# Patient Record
Sex: Female | Born: 1945 | Hispanic: No | Smoking: Never smoker
Health system: Southern US, Community
[De-identification: ages and names within clinical notes are randomized; demographics above are authoritative.]

## PROBLEM LIST (undated history)

## (undated) DIAGNOSIS — R519 Headache, unspecified: Secondary | ICD-10-CM

## (undated) DIAGNOSIS — G309 Alzheimer's disease, unspecified: Secondary | ICD-10-CM

## (undated) DIAGNOSIS — M797 Fibromyalgia: Secondary | ICD-10-CM

## (undated) DIAGNOSIS — I4891 Unspecified atrial fibrillation: Secondary | ICD-10-CM

## (undated) DIAGNOSIS — J302 Other seasonal allergic rhinitis: Secondary | ICD-10-CM

## (undated) DIAGNOSIS — F0280 Dementia in other diseases classified elsewhere without behavioral disturbance: Secondary | ICD-10-CM

## (undated) DIAGNOSIS — I10 Essential (primary) hypertension: Secondary | ICD-10-CM

## (undated) DIAGNOSIS — E7800 Pure hypercholesterolemia, unspecified: Secondary | ICD-10-CM

## (undated) DIAGNOSIS — R51 Headache: Secondary | ICD-10-CM

## (undated) DIAGNOSIS — G8929 Other chronic pain: Secondary | ICD-10-CM

## (undated) HISTORY — DX: Headache: R51

## (undated) HISTORY — DX: Other seasonal allergic rhinitis: J30.2

## (undated) HISTORY — DX: Pure hypercholesterolemia, unspecified: E78.00

## (undated) HISTORY — DX: Fibromyalgia: M79.7

## (undated) HISTORY — DX: Unspecified atrial fibrillation: I48.91

## (undated) HISTORY — DX: Alzheimer's disease, unspecified: G30.9

## (undated) HISTORY — PX: MEDIAL PARTIAL KNEE REPLACEMENT: SHX5965

## (undated) HISTORY — PX: OTHER SURGICAL HISTORY: SHX169

## (undated) HISTORY — DX: Dementia in other diseases classified elsewhere without behavioral disturbance: F02.80

## (undated) HISTORY — DX: Headache, unspecified: R51.9

## (undated) HISTORY — DX: Essential (primary) hypertension: I10

## (undated) HISTORY — PX: FOOT SURGERY: SHX648

## (undated) HISTORY — DX: Other chronic pain: G89.29

## (undated) HISTORY — PX: BACK SURGERY: SHX140

## (undated) HISTORY — PX: LUMBAR FUSION: SHX111

## (undated) HISTORY — PX: VESICOVAGINAL FISTULA CLOSURE W/ TAH: SUR271

---

## 1999-03-30 ENCOUNTER — Encounter: Payer: Self-pay | Admitting: Internal Medicine

## 1999-03-30 ENCOUNTER — Encounter: Admission: RE | Admit: 1999-03-30 | Discharge: 1999-03-30 | Payer: Self-pay | Admitting: Internal Medicine

## 1999-04-09 ENCOUNTER — Encounter: Admission: RE | Admit: 1999-04-09 | Discharge: 1999-04-09 | Payer: Self-pay | Admitting: Internal Medicine

## 1999-04-09 ENCOUNTER — Encounter: Payer: Self-pay | Admitting: Internal Medicine

## 1999-04-12 ENCOUNTER — Inpatient Hospital Stay (HOSPITAL_COMMUNITY): Admission: EM | Admit: 1999-04-12 | Discharge: 1999-04-13 | Payer: Self-pay | Admitting: Emergency Medicine

## 1999-04-12 ENCOUNTER — Encounter: Payer: Self-pay | Admitting: Emergency Medicine

## 1999-08-11 ENCOUNTER — Encounter: Payer: Self-pay | Admitting: *Deleted

## 1999-08-11 ENCOUNTER — Emergency Department (HOSPITAL_COMMUNITY): Admission: EM | Admit: 1999-08-11 | Discharge: 1999-08-11 | Payer: Self-pay | Admitting: *Deleted

## 2000-04-09 ENCOUNTER — Encounter: Payer: Self-pay | Admitting: Family Medicine

## 2000-04-09 ENCOUNTER — Encounter: Admission: RE | Admit: 2000-04-09 | Discharge: 2000-04-09 | Payer: Self-pay | Admitting: Family Medicine

## 2001-04-14 ENCOUNTER — Encounter: Admission: RE | Admit: 2001-04-14 | Discharge: 2001-04-14 | Payer: Self-pay | Admitting: Family Medicine

## 2001-04-14 ENCOUNTER — Encounter: Payer: Self-pay | Admitting: Family Medicine

## 2001-04-21 ENCOUNTER — Ambulatory Visit (HOSPITAL_COMMUNITY): Admission: RE | Admit: 2001-04-21 | Discharge: 2001-04-21 | Payer: Self-pay | Admitting: Cardiovascular Disease

## 2001-04-21 ENCOUNTER — Encounter: Payer: Self-pay | Admitting: Cardiovascular Disease

## 2002-04-16 ENCOUNTER — Encounter: Admission: RE | Admit: 2002-04-16 | Discharge: 2002-04-16 | Payer: Self-pay | Admitting: Family Medicine

## 2002-04-16 ENCOUNTER — Encounter: Payer: Self-pay | Admitting: Family Medicine

## 2003-02-14 ENCOUNTER — Encounter: Admission: RE | Admit: 2003-02-14 | Discharge: 2003-02-14 | Payer: Self-pay | Admitting: Neurosurgery

## 2003-03-07 ENCOUNTER — Encounter: Admission: RE | Admit: 2003-03-07 | Discharge: 2003-03-07 | Payer: Self-pay | Admitting: Neurosurgery

## 2003-03-21 ENCOUNTER — Encounter: Admission: RE | Admit: 2003-03-21 | Discharge: 2003-03-21 | Payer: Self-pay | Admitting: Neurosurgery

## 2003-03-26 DIAGNOSIS — F0280 Dementia in other diseases classified elsewhere without behavioral disturbance: Secondary | ICD-10-CM

## 2003-03-26 HISTORY — DX: Dementia in other diseases classified elsewhere, unspecified severity, without behavioral disturbance, psychotic disturbance, mood disturbance, and anxiety: F02.80

## 2003-04-05 ENCOUNTER — Encounter: Admission: RE | Admit: 2003-04-05 | Discharge: 2003-04-05 | Payer: Self-pay | Admitting: Neurosurgery

## 2003-04-22 ENCOUNTER — Encounter: Admission: RE | Admit: 2003-04-22 | Discharge: 2003-04-22 | Payer: Self-pay | Admitting: Family Medicine

## 2003-05-09 ENCOUNTER — Inpatient Hospital Stay (HOSPITAL_COMMUNITY): Admission: RE | Admit: 2003-05-09 | Discharge: 2003-05-17 | Payer: Self-pay | Admitting: Neurosurgery

## 2003-06-15 ENCOUNTER — Encounter: Admission: RE | Admit: 2003-06-15 | Discharge: 2003-06-15 | Payer: Self-pay | Admitting: *Deleted

## 2003-08-18 ENCOUNTER — Encounter: Admission: RE | Admit: 2003-08-18 | Discharge: 2003-08-18 | Payer: Self-pay | Admitting: Neurosurgery

## 2004-02-06 ENCOUNTER — Ambulatory Visit: Payer: Self-pay | Admitting: Family Medicine

## 2004-04-19 ENCOUNTER — Ambulatory Visit: Payer: Self-pay | Admitting: Family Medicine

## 2004-05-22 ENCOUNTER — Ambulatory Visit: Payer: Self-pay | Admitting: Family Medicine

## 2004-06-04 ENCOUNTER — Ambulatory Visit: Payer: Self-pay | Admitting: Family Medicine

## 2004-07-23 ENCOUNTER — Ambulatory Visit: Payer: Self-pay | Admitting: Family Medicine

## 2004-08-22 ENCOUNTER — Ambulatory Visit: Payer: Self-pay | Admitting: Family Medicine

## 2004-10-01 ENCOUNTER — Ambulatory Visit: Payer: Self-pay | Admitting: Family Medicine

## 2004-11-22 ENCOUNTER — Ambulatory Visit: Payer: Self-pay | Admitting: Family Medicine

## 2004-12-26 ENCOUNTER — Ambulatory Visit (HOSPITAL_COMMUNITY): Admission: RE | Admit: 2004-12-26 | Discharge: 2004-12-26 | Payer: Self-pay | Admitting: Specialist

## 2005-01-09 ENCOUNTER — Ambulatory Visit: Payer: Self-pay | Admitting: Family Medicine

## 2005-03-05 ENCOUNTER — Ambulatory Visit: Payer: Self-pay | Admitting: Family Medicine

## 2005-04-23 ENCOUNTER — Ambulatory Visit: Payer: Self-pay | Admitting: Family Medicine

## 2005-05-08 ENCOUNTER — Ambulatory Visit: Payer: Self-pay | Admitting: Family Medicine

## 2005-06-05 ENCOUNTER — Ambulatory Visit (HOSPITAL_BASED_OUTPATIENT_CLINIC_OR_DEPARTMENT_OTHER): Admission: RE | Admit: 2005-06-05 | Discharge: 2005-06-06 | Payer: Self-pay | Admitting: Orthopedic Surgery

## 2008-07-13 ENCOUNTER — Emergency Department (HOSPITAL_COMMUNITY): Admission: EM | Admit: 2008-07-13 | Discharge: 2008-07-13 | Payer: Self-pay | Admitting: Emergency Medicine

## 2009-07-25 ENCOUNTER — Encounter: Payer: Self-pay | Admitting: Pulmonary Disease

## 2009-08-16 ENCOUNTER — Ambulatory Visit: Payer: Self-pay | Admitting: Pulmonary Disease

## 2009-08-16 DIAGNOSIS — R0602 Shortness of breath: Secondary | ICD-10-CM | POA: Insufficient documentation

## 2009-08-16 DIAGNOSIS — Z9189 Other specified personal risk factors, not elsewhere classified: Secondary | ICD-10-CM | POA: Insufficient documentation

## 2009-08-16 DIAGNOSIS — J309 Allergic rhinitis, unspecified: Secondary | ICD-10-CM | POA: Insufficient documentation

## 2009-08-16 DIAGNOSIS — E785 Hyperlipidemia, unspecified: Secondary | ICD-10-CM

## 2009-08-16 DIAGNOSIS — I10 Essential (primary) hypertension: Secondary | ICD-10-CM | POA: Insufficient documentation

## 2009-08-16 LAB — CONVERTED CEMR LAB
BUN: 19 mg/dL (ref 6–23)
CO2: 30 meq/L (ref 19–32)
Calcium: 9.8 mg/dL (ref 8.4–10.5)
Chloride: 103 meq/L (ref 96–112)
Creatinine, Ser: 0.7 mg/dL (ref 0.4–1.2)
GFR calc non Af Amer: 86.42 mL/min (ref >60)
Glucose, Bld: 106 mg/dL — ABNORMAL HIGH (ref 70–99)
Potassium: 4.1 meq/L (ref 3.5–5.1)
Sodium: 142 meq/L (ref 135–145)

## 2009-08-17 ENCOUNTER — Ambulatory Visit: Payer: Self-pay | Admitting: Cardiology

## 2009-08-17 ENCOUNTER — Encounter: Payer: Self-pay | Admitting: Pulmonary Disease

## 2009-08-24 ENCOUNTER — Telehealth: Payer: Self-pay | Admitting: Pulmonary Disease

## 2010-01-25 ENCOUNTER — Inpatient Hospital Stay (HOSPITAL_COMMUNITY): Admission: RE | Admit: 2010-01-25 | Discharge: 2010-01-29 | Payer: Self-pay | Admitting: Specialist

## 2010-04-14 ENCOUNTER — Encounter: Payer: Self-pay | Admitting: Family Medicine

## 2010-04-15 ENCOUNTER — Encounter: Payer: Self-pay | Admitting: Pulmonary Disease

## 2010-04-24 NOTE — Progress Notes (Signed)
Summary: talk to nurse  Phone Note Call from Patient   Caller: Patient Call For: Bradford Cazier Summary of Call: pt call last week and have not received call back Initial call taken by: Rickard Patience,  August 24, 2009 9:57 AM  Follow-up for Phone Call        [email or web address]   Philipp Deputy [facility]  August 24, 2009 11:06 AM   pt wanted CT results. pt advised. Carron Curie CMA  August 25, 2009 3:48 PM

## 2010-04-24 NOTE — Letter (Signed)
Summary: [facility]  [facility]   Imported By: Sherian Rein 10/20/2009 13:24:19  _____________________________________________________________________  External Attachment:    Type:   Image     Comment:   External Document

## 2010-04-24 NOTE — Assessment & Plan Note (Signed)
Summary: SOB/ COUGH   Visit Type:  Initial Consult Copy to:  pcp Primary Provider/Referring Provider:  Dr. Orson Gear  CC:  Pt c/o non-productive  cough and increased SOB all the time worse with activity.  History of Present Illness: 65/F, never smoker for evaluation of dyspnea. She has fibromyalgia & chronic paina s a ersult. She has gained >50 lbs in the last 2-3 yrs. She reports dyspnea on exertion x 2 years. She reports an occasional dry cough but denies chest pain, orthopnea, palpitations or PND. She does not desire sleep testing since she will not be able to use a cpap - has seen her husband use this. Dr Allyson Sabal undertook cardiac testing . She desaturated to 90% with HR going up 25 points on walking around the office. Overnight oximetry did not show significant deaturation. CT chest with contrast 10/10 showed minimal scarring inthe right lung & left base. Ct angio was neg for PE.  Preventive Screening-Counseling & Management  Alcohol-Tobacco     Smoking Status: never  Current Medications (verified): 1)  Flonase 50 Mcg/act Susp (Fluticasone Propionate) .... As Needed 2)  Pravastatin Sodium 40 Mg Tabs (Pravastatin Sodium) .... Take 2 Tab By Mouth At Bedtime 3)  Cymbalta 30 Mg Cpep (Duloxetine Hcl) .... Take 2 Tablet By Mouth Once A Day 4)  Omeprazole 20 Mg Cpdr (Omeprazole) .... Take 2 Tablet By Mouth Once A Day 5)  Ibuprofen 400 Mg Tabs (Ibuprofen) .... Take 1 To 2 Tablet By Mouth Every 8 Hours As Needed 6)  Cyclobenzaprine Hcl 10 Mg Tabs (Cyclobenzaprine Hcl) .... Take 1 Tablet By Mouth Three Times A Day 7)  Alprazolam 0.5 Mg Tabs (Alprazolam) .... Take 1 Tab By Mouth At Bedtime 8)  Aricept 10 Mg Tabs (Donepezil Hcl) .... Take 1 Tablet By Mouth Once A Day 9)  Bisoprolol-Hydrochlorothiazide 10-6.25 Mg Tabs (Bisoprolol-Hydrochlorothiazide) .... Take 1 Tablet By Mouth Once A Day 10)  Ventolin Hfa 108 (90 Base) Mcg/act Aers (Albuterol Sulfate) .... As Needed  Allergies  (verified): 1)  ! Codeine  Past History:  Past Medical History: HEADACHE, CHRONIC, HX OF (ICD-V15.9) HYPERTENSION (ICD-401.9) HYPERLIPIDEMIA (ICD-272.4) ALLERGIC RHINITIS (ICD-477.9)    Past Surgical History: Back surgery-2005 Breast surgery-left  Hysterectomy-?1971  Family History: Family History Emphysema -father, sister Family History Asthma-sister Heart disease-sister  Social History: Marital Status: Married lives with Harland German Children: Yes Occupation: Retired Patient never smoked.  Smoking Status:  never  Review of Systems       The patient complains of shortness of breath with activity, shortness of breath at rest, non-productive cough, chest pain, weight change, difficulty swallowing, headaches, nasal congestion/difficulty breathing through nose, anxiety, depression, hand/feet swelling, and joint stiffness or pain.  The patient denies productive cough, coughing up blood, irregular heartbeats, acid heartburn, indigestion, loss of appetite, abdominal pain, sore throat, tooth/dental problems, sneezing, itching, ear ache, rash, change in color of mucus, and fever.    Vital Signs:  Patient profile:   65 year old female Height:      62 inches Weight:      232 pounds BMI:     42.59 O2 Sat:      94 % on Room air Temp:     97.7 degrees F oral Pulse rate:   65 / minute BP sitting:   122 / 80  (left arm) Cuff size:   large  Vitals Entered By: Zackery Barefoot CMA (Aug 16, 2009 11:18 AM)  O2 Flow:  Room air CC: Pt c/o non-productive  cough, increased SOB all the time worse with activity Comments Medications reviewed with patient Verified contact number and pharmacy with patient Zackery Barefoot CMA  Aug 16, 2009 11:19 AM  Ambulatory Pulse Oximetry  Resting; HR_87____    02 Sat__96%RA___  Lap1 (185 feet)   HR_94____   02 Sat_90%RA____ Lap2 (185 feet)   HR__89___   02 Sat_90%RA____    Lap3 (185 feet)   HR__86___   02 Sat_89%RA____  _X__Test Completed without  Difficulty ___Test Stopped due to: Although pt completed 3 laps I still checdked for recovery and her oxygen saturation approx 2 min after walk was 98%RA w/ a HR of 75 Denna Haggard, CMA  Aug 16, 2009 12:16 PM     Physical Exam  Additional Exam:  Gen. Pleasant, obese, in no distress, normal affect ENT - no lesions, no post nasal drip Neck: No JVD, no thyromegaly, no carotid bruits Lungs: no use of accessory muscles, no dullness to percussion, clear without rales or rhonchi  Cardiovascular: Rhythm regular, heart sounds  normal, no murmurs or gallops, no peripheral edema Abdomen: soft and non-tender, no hepatosplenomegaly, BS normal. Musculoskeletal: No deformities, no cyanosis or clubbing Neuro:  alert, non focal     CT of Chest  Procedure date:  08/17/2009  Findings:       IMPRESSION: No evidence of pulmonary embolus.   Scattered borderline sized mediastinal lymph nodes, presumably reactive.   Dependent atelectasis.  Pulmonary Function Test Date: 08/16/2009 11:58 AM Gender: Female  Pre-Spirometry FVC    Value: 2.40 L/min   % Pred: 85.90 % FEV1    Value: 1.78 L     Pred: 2.13 L     % Pred: 83.30 % FEV1/FVC  Value: 74.17 %     % Pred: 96.10 %  Impression & Recommendations:  Problem # 1:  DYSPNEA (ICD-786.05) Spirometry did not show airway obstruction & overnight oximetry appears nml Doubt pulmonary cause of dyspnea. If cardiac testing is also normal, then probable cause is obesity & deconditioning. Recommended rehab program but she was not enthusiastic about this. Orders: DME Referral (DME) Radiology Referral (Radiology) Consultation Level IV (16109) Spirometry w/Graph (94010)  Medications Added to Medication List This Visit: 1)  Flonase 50 Mcg/act Susp (Fluticasone propionate) .... As needed 2)  Pravastatin Sodium 40 Mg Tabs (Pravastatin sodium) .... Take 2 tab by mouth at bedtime 3)  Cymbalta 30 Mg Cpep (Duloxetine hcl) .... Take 2 tablet by mouth once a  day 4)  Omeprazole 20 Mg Cpdr (Omeprazole) .... Take 2 tablet by mouth once a day 5)  Ibuprofen 400 Mg Tabs (Ibuprofen) .... Take 1 to 2 tablet by mouth every 8 hours as needed 6)  Cyclobenzaprine Hcl 10 Mg Tabs (Cyclobenzaprine hcl) .... Take 1 tablet by mouth three times a day 7)  Alprazolam 0.5 Mg Tabs (Alprazolam) .... Take 1 tab by mouth at bedtime 8)  Aricept 10 Mg Tabs (Donepezil hcl) .... Take 1 tablet by mouth once a day 9)  Bisoprolol-hydrochlorothiazide 10-6.25 Mg Tabs (Bisoprolol-hydrochlorothiazide) .... Take 1 tablet by mouth once a day 10)  Ventolin Hfa 108 (90 Base) Mcg/act Aers (Albuterol sulfate) .... As needed  Other Orders: TLB-BMP (Basic Metabolic Panel-BMET) (80048-METABOL)  Patient Instructions: 1)  Copy sent to:dr shevlin 2)  Please schedule a follow-up appointment in 1 month. 3)  A Spiral CT of the Chest has been recommended.  Your imaging study may require preauthorization.    Immunization History:  Influenza Immunization History:    Influenza:  historical (01/23/2009)  Pneumovax Immunization History:    Pneumovax:  historical (06/29/2007)    CardioPerfect Spirometry  ID: [id number] Patient: DEEPTI, GUNAWAN DOB: 1945/08/19 Age: 65 Years Old Sex: Female Race: White Physician: Khloee Garza Height: 62 Weight: 232 Status: Confirmed Recorded: 08/16/2009 11:58 AM  Parameter  Measured Predicted %Predicted FVC     2.40        2.79        85.90 FEV1     1.78        2.13        83.30 FEV1%   74.17        77.21        96.10 PEF    4.09        5.55        73.60   Interpretation: Within normal limits

## 2010-04-24 NOTE — Procedures (Signed)
Summary: [facility]   Imported By: Lester Shelby 08/30/2009 10:04:18  _____________________________________________________________________  External Attachment:    Type:   Image     Comment:   External Document

## 2010-06-05 LAB — CBC
HCT: 29.1 % — ABNORMAL LOW (ref 36.0–46.0)
HCT: 29.6 % — ABNORMAL LOW (ref 36.0–46.0)
HCT: 31.7 % — ABNORMAL LOW (ref 36.0–46.0)
Hemoglobin: 10.2 g/dL — ABNORMAL LOW (ref 12.0–15.0)
MCH: 31.8 pg (ref 26.0–34.0)
MCH: 32 pg (ref 26.0–34.0)
MCH: 32.1 pg (ref 26.0–34.0)
MCH: 32.4 pg (ref 26.0–34.0)
MCHC: 34.2 g/dL (ref 30.0–36.0)
MCHC: 34.3 g/dL (ref 30.0–36.0)
MCHC: 34.3 g/dL (ref 30.0–36.0)
MCHC: 34.9 g/dL (ref 30.0–36.0)
MCV: 92.9 fL (ref 78.0–100.0)
MCV: 92.9 fL (ref 78.0–100.0)
Platelets: 174 10*3/uL (ref 150–400)
Platelets: 240 10*3/uL (ref 150–400)
RBC: 3.15 MIL/uL — ABNORMAL LOW (ref 3.87–5.11)
RDW: 13.5 % (ref 11.5–15.5)
RDW: 13.7 % (ref 11.5–15.5)
RDW: 13.7 % (ref 11.5–15.5)
WBC: 9.6 10*3/uL (ref 4.0–10.5)

## 2010-06-05 LAB — BASIC METABOLIC PANEL
BUN: 10 mg/dL (ref 6–23)
BUN: 4 mg/dL — ABNORMAL LOW (ref 6–23)
BUN: 5 mg/dL — ABNORMAL LOW (ref 6–23)
BUN: 7 mg/dL (ref 6–23)
CO2: 26 mEq/L (ref 19–32)
CO2: 28 mEq/L (ref 19–32)
CO2: 28 mEq/L (ref 19–32)
CO2: 29 mEq/L (ref 19–32)
Calcium: 8.6 mg/dL (ref 8.4–10.5)
Calcium: 8.9 mg/dL (ref 8.4–10.5)
Calcium: 9 mg/dL (ref 8.4–10.5)
Chloride: 100 mEq/L (ref 96–112)
Chloride: 102 mEq/L (ref 96–112)
Creatinine, Ser: 0.6 mg/dL (ref 0.4–1.2)
Creatinine, Ser: 0.66 mg/dL (ref 0.4–1.2)
GFR calc non Af Amer: >60 mL/min (ref >60)
GFR calc non Af Amer: >60 mL/min (ref >60)
Glucose, Bld: 107 mg/dL — ABNORMAL HIGH (ref 70–99)
Glucose, Bld: 111 mg/dL — ABNORMAL HIGH (ref 70–99)
Glucose, Bld: 115 mg/dL — ABNORMAL HIGH (ref 70–99)
Glucose, Bld: 116 mg/dL — ABNORMAL HIGH (ref 70–99)
Potassium: 3.8 mEq/L (ref 3.5–5.1)
Potassium: 3.8 mEq/L (ref 3.5–5.1)
Sodium: 139 mEq/L (ref 135–145)

## 2010-06-05 LAB — PROTIME-INR
INR: 1.12 (ref 0.00–1.49)
INR: 1.67 — ABNORMAL HIGH (ref 0.00–1.49)
Prothrombin Time: 16.4 seconds — ABNORMAL HIGH (ref 11.6–15.2)
Prothrombin Time: 19.9 seconds — ABNORMAL HIGH (ref 11.6–15.2)

## 2010-06-05 LAB — TYPE AND SCREEN: Antibody Screen: NEGATIVE

## 2010-06-06 LAB — DIFFERENTIAL
Eosinophils Absolute: 0.3 10*3/uL (ref 0.0–0.7)
Lymphocytes Relative: 37 % (ref 12–46)
Lymphs Abs: 2.9 10*3/uL (ref 0.7–4.0)
Monocytes Relative: 9 % (ref 3–12)
Neutro Abs: 4 10*3/uL (ref 1.7–7.7)
Neutrophils Relative %: 50 % (ref 43–77)

## 2010-06-06 LAB — URINALYSIS, ROUTINE W REFLEX MICROSCOPIC
Hgb urine dipstick: NEGATIVE
Nitrite: NEGATIVE
Protein, ur: NEGATIVE mg/dL
Urobilinogen, UA: 0.2 mg/dL (ref 0.0–1.0)

## 2010-06-06 LAB — CBC
HCT: 38.5 % (ref 36.0–46.0)
Hemoglobin: 13.1 g/dL (ref 12.0–15.0)
MCH: 31.9 pg (ref 26.0–34.0)
MCHC: 33.9 g/dL (ref 30.0–36.0)
RDW: 13.9 % (ref 11.5–15.5)

## 2010-06-06 LAB — COMPREHENSIVE METABOLIC PANEL
CO2: 27 mEq/L (ref 19–32)
Calcium: 9.7 mg/dL (ref 8.4–10.5)
Creatinine, Ser: 0.69 mg/dL (ref 0.4–1.2)
GFR calc non Af Amer: >60 mL/min (ref >60)
Glucose, Bld: 88 mg/dL (ref 70–99)
Total Protein: 6.8 g/dL (ref 6.0–8.3)

## 2010-06-06 LAB — APTT: aPTT: 31 seconds (ref 24–37)

## 2010-06-06 LAB — SURGICAL PCR SCREEN: Staphylococcus aureus: NEGATIVE

## 2010-06-06 LAB — PROTIME-INR
INR: 1.01 (ref 0.00–1.49)
Prothrombin Time: 13.5 seconds (ref 11.6–15.2)

## 2010-08-10 NOTE — Op Note (Signed)
Christie Sanders, Christie Sanders                ACCOUNT NO.:  [id number]   MEDICAL RECORD NO.:  [id number]          PATIENT TYPE:  AMB   LOCATION:  DAY                          FACILITY:  [facility]   PHYSICIAN:  Jene Every, M.D.    DATE OF BIRTH:  [birth year]   DATE OF PROCEDURE:  12/26/2004  DATE OF DISCHARGE:                                 OPERATIVE REPORT   PREOPERATIVE DIAGNOSIS:  Medial meniscus tear, degenerative joint disease.  Subchondral defect, medial femoral condyle.   POSTOPERATIVE DIAGNOSIS:  Medial meniscus tear, degenerative joint disease.  Subchondral defect, medial femoral condyle.   PROCEDURE PERFORMED:  Right knee arthroscopy, posterior medial meniscectomy,  chondroplasty, medial femoral condyle and of the patella, abrasion  arthroplasty and microfracture technique of the medial femoral condyle.   ANESTHESIA:  General.   ASSISTANT:  Roma Schanz, P.A.   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  Medial joint symptoms.  Operative intervention was indicated for posterior medial meniscectomy,  debridement, and abrasion of the area of OCD and AVN of the medial femoral  condyle.  Risks and benefits, including bleeding, infection, no change in  symptoms, worsening symptoms, need for total knee arthroplasty in the  future.  It is felt to be a chance to the medial femoral condyle portion in  an effort to avoid a knee replacement.  This was discussed in detail with  the patient that this type of procedure may very well have not  revascularized the femoral condyle, and she may require a total knee  arthroplasty, even within six weeks of this procedure.  It is hoped that the  posterior medial meniscectomy and debridement would help at least a portion  of her symptoms.   PROCEDURE:  The medial parapatellar portal was fashioned with a #11 blade  after localization with an 18 gauge needle,  sparing the medial meniscus.  Inspection revealed some chondral flap tears of the medial femoral condyle,  complex tear of the posterior half of the medial meniscus.  A basket rongeur  was introduced and utilized to perform partial medial meniscectomy to a  stable base with further contour with the 4.2 coude shaver. Loose  cartilaginous debris was debrided from the medial femoral condyle from the  medial-most aspect of that.  An area of the grade 4 change in the area  corresponding to the MRI.  The area of AVN/coude shaver was utilized to  abrade this area.  I did take a small awl and just hand-pushed to some  punctate areas approximately 1 cm apart.  Thinned this lesion that measured  approximately 1 cm wide.  Reduced the pressure.  Good bleeding bone was  noted.  No fragmentation noted.  The edges were without chondral flap tears.  __________ in the medial femoral condyle were normal.  The femoral condyle  and tibial plateau and meniscus were stable to probe palpation over the  significant chondral lesion.  There were grade 3 changes of the patella.  This chondroplasty was performed here as well.  The  knee was copiously  lavaged.  Examined the gutters in all compartments.  No residual meniscus.  Again, good bleeding tissue was noted at the site of the AVN.  Next, all  instrumentation was removed.  Portals were closed with 4-0 nylon simple  sutures.  Marcaine 0.25% with epinephrine was infiltrated into the joint.  The wound  was dressed sterilely.  She was awakened without difficulty.  Transported to  the recovery room in satisfactory condition.  Patient tolerated the  procedure well with no complications.      Jene Every, M.D.  Electronically Signed     JB/MEDQ  D:  12/26/2004  T:  12/26/2004  Job:  [id number]

## 2010-08-10 NOTE — Op Note (Signed)
NAME:  Christie Sanders, Christie Sanders                          ACCOUNT NO.:  [id number]   MEDICAL RECORD NO.:  [id number]                   PATIENT TYPE:  INP   LOCATION:  2899                                 FACILITY:  [facility]   PHYSICIAN:  Kathaleen Maser. Pool, M.D.                 DATE OF BIRTH:  [birth year]   DATE OF PROCEDURE:  05/09/2003  DATE OF DISCHARGE:                                 OPERATIVE REPORT   PREOPERATIVE DIAGNOSES:  L2-3, L3-4, L4-5 and L5-S1 degenerative disk  disease with degenerative scoliosis and grade 2 L5-S1spondylolisthesis.   POSTOPERATIVE DIAGNOSES:  L2-3, L3-4, L4-5 and L5-S1 degenerative disk  disease with degenerative scoliosis and grade 2 L5-S1spondylolisthesis.   OPERATION PERFORMED:  L2-3, L3-4, L4-5 and L5-S1 decompressive laminectomies  with foraminotomy, Gill procedure.  L2-3, L3-4, L4-5 and L5-S1 posterior  lumbar interbody fusion utilizing tangent wedges and local autografting.  L2  through S1 posterolateral fusion utilizing segmental pedicle screw  instrumentation and local autografting.   SURGEON:  Kathaleen Maser. Pool, M.D.   ASSISTANT:  Tia Alert, MD   ANESTHESIA:  General endotracheal.   INDICATIONS FOR PROCEDURE:  The patient is a 65 year old female with a  history of chronic back and bilateral lower extremity symptoms failing all  conservative management.  Work-ups demonstrated evidence of severe  multilevel degenerative disk disease with degenerative scoliosis causing  stenosis and marked structural deformity.  At L5-S1 she also had evidence of  a grade 2 spondylolisthesis which appears to be degenerative in nature.  The  patient has been counseled as to her options.  She has decided to proceed  with multilevel decompression and fusion for hopeful improvement in her  symptoms.   DESCRIPTION OF PROCEDURE:  The patient was taken to the operating room and  placed on the table in the supine position.  After adequate level of  anesthesia was achieved, the  patient was positioned prone onto a Wilson  frame and appropriately padded.  The patient's lumbar region was prepped and  draped sterilely.  A 10 blade was used to make a linear skin incision  extending from L1 down to the midsacrum.  This was carried down sharply in  the midline.  A subperiosteal dissection was then performed exposing the  lamina and facet joints as well as the transverse processes of L2, L3, L4,  L5 and the sacrum and ala bilaterally.  Deep self-retaining retractor was  placed.  Intraoperative fluoroscopy was used, the levels were confirmed.  Decompressive laminectomy in the form of a Gill procedure was then performed  at L2, L3, L4 and L5.  The inferior facets of the aforementioned level were  all removed as were the complete lamina.  Superior facets of L3, 4, 5 and S1  were also resected.  All bone was cleaned and used in later autografting.  Epidural scarring and ligamentum flavum was elevated and resected in  piecemeal  fashion using Kerrison rongeurs.  The underlying thecal sac and  exiting L2, L3, L4, L5 and S1 nerve roots were identified.  Wide  foraminotomies were performed along the course of the exiting nerve roots.  Starting first at L2-3, the epidural venous plexus was coagulated and cut.  Thecal sac and nerve roots were mobilized and retracted toward the midline.  Disk space was isolated and incised with a 15 blade in rectangular fashion.  Wide disk space cleanout was then achieved using pituitary rongeurs and  upward angled pituitary rongeurs and Epstein curets.  The procedure was then  repeated on the contralateral side.  Disk space was then subsequently  distracted up to 10 mm, a 10 mm distractor was left in the patient's right  side.  The thecal sac and nerve root was protected on the left side.  The  disk space was then reamed with 10 mm tangent box cutter and then cut with  10 chisel.  Soft tissues were then removed from the interspace.  A 10 x 26  mm  tangent wedge was then impacted into place and recessed approximately 2  mm from the posterior cortical margin.  Distractor was removed from the  patient's right side disk space with further curettage.  Morcelized  allograft was packed in the interspace.  A second 10 x 26 mm tangent wedge  was then impacted on the patient's right side.  The procedure was then  repeated at the L3-4, L4-5 and L5-S1 levels, this time using 8 mm tangent  grafts bilaterally as well as local autografting as well.  There were no  complications with any of the levels.  Pedicles at L2, 3, 4, 5 and S1 were  identified utilizing surface landmarks and intraoperative fluoroscopy.  Superficial bone overlying the pedicles was removed using high speed drill.  Each pedicle was then probed using pedicle awl.  Each pedicle awl track was  then tapped with a 5.25 mm screw tap.  Each screw tap hole was probed and  found to be solid within bone.  6.75 x 40 mm spiral 90 D pedicle screws were  placed bilaterally at L2, 3, 4 and 5.  6.75 x 35 mm screws were placed  bilaterally at S1.  All 10 screws were found to be well positioned by both  direct visualization and intraoperative fluoroscopy.  The transverse  processes and lateral facet joint complexes were then decorticated using a  high speed drill.  Morcelized autograft was packed posterolaterally for  later fusion.  A short segment titanium rod was then contoured and placed in  the screw heads from L2 to S1 bilaterally.  Locking cap was then engaged  over the screw heads.  They were given a final tightening with the construct  under compression.  Transverse connectors were placed times two. Final  images revealed good position of bone graft and hardware at the proper  operative level with normal alignment of the spine.  Medium Hemovac drain  was left in the epidural space.  Gelfoam was placed topically for hemostasis which was found to be good.  The wound was then irrigated one  final time and  then closed in layers with Vicryl sutures.  Steri-Strips and sterile  dressing were applied.  There were no apparent complications.  The patient  tolerated the procedure well and returned to the recovery room  postoperatively.  Henry A. Pool, M.D.    HAP/MEDQ  D:  05/09/2003  T:  05/09/2003  Job:  [id number]

## 2010-08-10 NOTE — Op Note (Signed)
Christie Sanders, Christie Sanders                ACCOUNT NO.:  [id number]   MEDICAL RECORD NO.:  [id number]          PATIENT TYPE:  AMB   LOCATION:  DSC                          FACILITY:  [facility]   PHYSICIAN:  Leonides Grills, M.D.     DATE OF BIRTH:  1945-08-18   DATE OF PROCEDURE:  06/05/2005  DATE OF DISCHARGE:                                 OPERATIVE REPORT   PREOPERATIVE DIAGNOSIS:  1.  Right transmetatarsal joint hypermobility with arthritis.  2.  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  3.  Right tight gastroc.  4.  Right second metatarsalgia.  5.  Right second metatarsal fracture nonunion.  6.  Right complication of hardware, foot.  7.  Right second through fifth hammer toes.   POSTOPERATIVE DIAGNOSIS:  1.  Right transmetatarsal joint hypermobility with arthritis.  2.  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  3.  Right tight gastroc.  4.  Right second metatarsalgia.  5.  Right second metatarsal fracture nonunion.  6.  Right complication of hardware, foot.  7.  Right second through fifth hammer toes.   OPERATION:  1.  Right first transmetatarsal joint fusion with plantar flexion osteotomy.  2.  Right second and third transmetatarsal joint fusion.  3.  Right local bone graft.  4.  Stress x-rays, right foot.  5.  Right gastroc slide.  6.  Right second Weil metatarsal shortening osteotomy.  7.  Open reduction and internal fixation right second metatarsal fracture      nonunion.  8.  Right second through fifth toes metatarsal phalangeal joint dorsal      capsulotomies with collateral releases.  9.  Right second through fifth toes proximal phalanx head resections.  10. Right second through fifth toes extensor digitorum brevis to extensor      digitorum longus tendon transfers.  11. Right hardware removal, deep foot.   ANESTHESIA:  General.   SURGEON:  Leonides Grills, M.D.   ASSISTANT:  Lianne Cure, P.A.-C.   ESTIMATED BLOOD LOSS:  Minimal.   TOURNIQUET TIME:  2 hours.   COMPLICATIONS:  None.   DISPOSITION:  Stable to the PR.   INDICATIONS FOR PROCEDURE:  This is a 65 year old female who had previous  surgery and developed mid foot pain as well as forefoot pain with hammer toe  deformities and distalization of fat pad with tight gastroc.  She was  consented for the above procedure.  All risks which include infection,  neurovascular injury, malunion, nonunion, hardware rotation, hardware  failure, persistent pain, worsening pain, prolonged recovery, arthritis of  neighboring joints, stiffness, recurrence of deformity, were all explained,  questions were encouraged and answered.   OPERATION:  The patient was brought to the operating room and placed in  supine position.  After adequate general endotracheal anesthesia was  administered as well as Ancef 1 gram IV piggyback, a small bump was placed  under the right ipsilateral hip, all bony prominences were well padded.  The  right lower extremity was then prepped and draped in a sterile manner over a  proximally placed thigh tourniquet.  We  started the procedure with a  longitudinal incision over the medial aspect of the gastrocnemius muscle  tendinous junction.  Dissection was carried down through skin, hemostasis  was obtained.  The fascia was opened in line with the incision.  The  conjoined region was then developed between the gastroc and soleus.  The  soft tissue was then elevated off the posterior aspect of the gastrocnemius.  The sural nerve was identified and protected posteriorly.  The gastrocnemius  was then released with the curved Mayo scissors.  This had an outstanding  release of the tight gastroc.  The area was copiously irrigated with normal  saline.  The subcu was closed with 3-0 Vicryl and the skin was closed with 4-  0 Monocryl subcuticular stitch.  Steri-Strips were applied.   The limb was then gravity exsanguinated and the tourniquet was elevated to  290  mmHg.  A longitudinal incision over the previous incision was made  medial to the EHL tendon.  Dissection was carried down through the skin,  hemostasis was obtained.  The incision actually traversed the EHL.  The EHL  was protected in its sheath throughout the procedure.  We then identified  the first and second TMT joints.  We did not dissect out the nonunion site  for there was too much risk to the dorsalis pedes artery and we elected not  to do this.  We then proceeded to elevate over the area respectively.  We  then dissected distally down through the K-wires which appeared to be  prominent plantarly in the lateral view.  We then removed the K-wires that  were down to bone with needle nose pliers.  When both K-wires were intact,  no evidence of hardware failure.  We then made a notch approximately 2 cm  distal to the first TMT joint line.  We then entered the first TMT joint.  There was a tremendous amount of spur around this area.  This was then  removed with the rongeur and placed on the back table for local bone graft.  There was erosion dorsally and there was gross motion through this joint.  We then made an osteotomy where we resected a wedge based plantar off the  base of the first and medial cuneiform.  We then reduced the joint with a  two point reduction clamp and palpated the first metatarsal head plantarly  and it was in excellent position.  We also checked in the lateral view, as  well.  We then prepared either side of the joint surface with curved 1/4  inch osteotome, again, which was done prior to this as well as multiple 2 mm  drill holes on either side of the joint.  We then placed a two point  reduction clamp back and compressed the fusion site respectively.  Again,  this was rechecked and there was no change in the plantar flexion of the  first ray.  We then placed two 3.5 mm fully threaded cortical lag screws using 3.5 and 2.5 mm drill holes respectively with  excellent purchase and  compression across the fusion site.  The heads were counter sunk, as well.   Once this was completed, we then made a longitudinal incision over the  lateral aspect of the second and over the third tarsal metatarsal joints.  Dissection was carried down through the skin, hemostasis was obtained.  The  extensor tendons were protected and retracted out of harms way.  We then  entered the second and  third TMT joints.  These were both prepared and  cartilage was removed which was minimal in both joints.  Once the cartilage  was removed, multiple 2 mm drill holes were placed on either side of the  joint.  The nonunion site was prepared which had gross motion. There was  cartilaginous material in this area, as well.  Once this was completely  removed and was down to bone, multiple 2 mm drill holes were placed on  either side of the joint and this was compressed with a two point reduction  clamp.  We then applied a six hole quarter tubular plate dorsally.  We  placed two screws in a compression manner on either side of the nonunion  site initially and this compressed the nonunion site, very well.  This was  checked with a Glorious Peach.  We then placed a two point reduction clamp across the  second TMT joint and compressed this down, as well, and placed a screw from  the medial cuneiform into the base of the second metatarsal.  This had  excellent purchase and compression of this area.  We kept the compression in  this area and placed a screw on either side of the TMT joint in compression,  as well.  Again, this had outstanding maintenance of position and keeping  compression across the fusion site.  We then placed a 2.7 screw in the most  distal hole and the most proximal hole, as well, and we obtained x-rays.  Stress x-ray showed no gross motion and fixation of the proper position, as  well.  Throughout this procedure, local bone graft obtained from the drills  as well as spurs  were placed on the back table, as well.   We then placed a longitudinal incision on the dorsal aspect of the second  toe, dissection was carried down through the skin, hemostasis was obtained.  EDL and EDB tendons were identified and the EDL was tenotomized proximal  medial and the brevis distal lateral and retracted out of harms way for  later transfer.  A dorsal capsulotomy with collateral release was then  performed and the distal aspect of the proximal phalanx was skeletonized and  the head was then removed with a rongeur followed by a bone cutter with the  plane distally perpendicular to the long axis of the proximal phalanx.  We  did this procedure to the third, fourth, and fifth toes respectively, as  well.  Sometime during this period, we had to discontinue the tourniquet and  perform the rest without tourniquet.  Hemostasis was obtained, there was no  pulsatile bleeding.  After this was done and all the toes were prepared, we went back to the second MTP joint, plantar flexed the toe, and performed a  Weil metatarsal shortening osteotomy with sagittal saw blades, cutting the  metatarsal in a plane that is parallel to the plantar aspect of the foot  that was slightly intra-particularly dorsally within the metatarsal head.  The head was then translated approximately 5 mm and fixed with a 1.5 mm  fully threaded cortical lag screw using 1.5 and 1.1 mm drill holes  respectively.  This had excellent purchase and compression across the  osteotomy site.  The redundant bone was then trimmed off with a rongeur and,  again, this was placed on the back table for local bone graft.  We then went  back to the second, third, fourth, and fifth toes respectively and, after  the area was copiously  irrigated with normal saline, a 0.045 K-wire double  ended trocar antegrade through the middle and distal phalanx, reduced the  PIP joint, and fired this retrograde slightly.  We then reduced the MTP   joints and fired the K-wire across the MTP joint.  This was repeated for the  third, fourth, and fifth toes respectively.  All toes were pink and bleeding  at the tip.  Final x-rays were done prior to K-wire placement and showed  that fixation was in the proper position, there was no gross motion, and the  correction was excellent, as well, especially for left osteotomy which was  the Weil.   Once the K-wires were placed, we bent, cut, and capped them.  We then  performed EDB to EDL tendon transfers  using 3-0 PDS suture.  Once the  tendon transfers were completed for each toe respectively, we copiously  irrigated all the wounds with normal saline.  We then went back to the  first, second, third, tarsal metatarsal joint fusion as well as the nonunion  site and placed stress strain relieving bone graft with a bur that was  obtained throughout the procedure.  This had excellent onlay of bone graft  in this area, as well.  Once the K-wire was bent, cut, and capped, a skin  relieving incision was made on either side of the K-wire.  The wounds were  closed with 3-0 Vicryl subcuticular and 4-0 nylon for the skin.  A sterile  dressing was applied.  A modified Docken dressing was applied.  The patient  was stable to the PR.      Leonides Grills, M.D.  Electronically Signed     PB/MEDQ  D:  06/05/2005  T:  06/06/2005  Job:  [id number]

## 2010-08-10 NOTE — Cardiovascular Report (Signed)
[facility]  Patient:    Christie Sanders, Christie Sanders Visit Number: [id number] MRN: [id number]          Service Type: CAT Location: [facility] 2899 10 Attending Physician:  Berry, Jonathan [place] Dictated by:   Runell Gess, M.D. Proc. Date: 04/21/01 Admit Date:  04/21/2001   CC:         Cardiac Catheterization Laboratory  Elease Hashimoto A. Benedetto Goad, M.D. [facility]  [facility], [place] [address], [place], [place] [address]   Cardiac Catheterization  PROCEDURES PERFORMED: Cardiac catheterization.  INDICATIONS: The patient is a 65 year old, Native American female, with positive cardiac risk factors and atypical chest pain. She was catheterized in 1996 and found to have normal coronaries. She presents now for diagnostic coronary arteriography to rule out ischemic etiology to her chest pain and shortness of breath symptomatology.  DESCRIPTION OF PROCEDURE: The patient was brought to the second floor [facility] in the postabsorptive state.  She was premedicated with p.o. Valium.  Her right groin was prepped and shaved in the usual sterile fashion.  Xylocaine 1% was used for local anesthesia.  A 6 French sheath was inserted into the right femoral artery using standard Seldinger technique.  A 6 French right and left Judkins diagnostic catheter as well as a 6 French pigtail catheter were used for selective coronary angiography, left ventriculography, respectively. Visipaque dye was used for the entirety of the case.  Retrograde, aortic, left ventricular, and pullback pressures were recorded.  HEMODYNAMICS: 1. Aortic systolic pressure 141, diastolic pressure 77. 2. Left ventricular systolic pressure 135, end-diastolic pressure 3.  SELECTIVE CORONARY ANGIOGRAPHY: 1. Left main: Normal. 2. LAD: Normal. 3. Left circumflex: Normal. 4. Right coronary artery: Dominant and normal.  LEFT VENTRICULOGRAPHY: The RAO left  ventriculogram was performed using 25 cc of Omnipaque dye at 12 cc/sec. The overall LVEF is estimated at greater than 60% without focal wall motion abnormalities.  IMPRESSION: The patient has normal coronary arteries and normal left ventricular function. I believe her chest pain is noncardiac and most likely gastrointestinal and/or related to her fibromyalgia. The sheaths were removed and pressure was held on the groin to achieve hemostasis. The patient left the lab in stable condition. She will be discharged home later today as an outpatient and will see me back in the office in two weeks in followup. Dictated by:   Runell Gess, M.D. Attending Physician:  Berry, Jonathan [place] DD:  04/21/01 TD:  04/21/01 Job: [id number]

## 2010-08-10 NOTE — Discharge Summary (Signed)
NAME:  Christie Sanders, Christie Sanders                          ACCOUNT NO.:  [id number]   MEDICAL RECORD NO.:  [id number]                   PATIENT TYPE:  INP   LOCATION:  3021                                 FACILITY:  [facility]   PHYSICIAN:  Kathaleen Maser. Pool, M.D.                 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   HISTORY OF PRESENT ILLNESS:  [name] is a 65 year old female with a  history of severe back and bilateral lower extremity symptoms who is failing  all conservative management.  Workups demonstrate evidence of extensive  degenerative scoliosis from her upper lumbar spine to her sacrum.  She has a  grade 2 L5-S1 spondylolisthesis as well.  The patient has been counseled as  to her options.  She is set to proceed with multilevel decompression and  fusion in the hopes of improving her symptoms.   HOSPITAL COURSE:  The patient was taken to the operating room where an  uncomplicated L2-S1 decompression and fusion with instrumentation was  performed.  Postoperatively, the patient had some difficulty with  hypotension and anemia, secondary to blood loss from surgery.  She was  transfused.  She was gradually mobilized with physical therapy.  Her overall  pain level gradually improved.  Her left lower extremity pain has completely  resolved.  Her right lower extremity symptoms were also much improved.  The  patient is making good progress.  At the time of discharge, the patient's  wound is healing well.  She is tolerating a regular diet.  Bowel and bladder  function are normal.  Pain control is very good.  She will be discharged to  home.  She will follow up in my office in one week.   CONDITION ON DISCHARGE:  Improved.                                                Henry A. Pool, M.D.    HAP/MEDQ   D:  06/01/2003  T:  06/02/2003  Job:  [id number]

## 2013-09-07 ENCOUNTER — Ambulatory Visit (INDEPENDENT_AMBULATORY_CARE_PROVIDER_SITE_OTHER): Payer: Medicare HMO | Admitting: Critical Care Medicine

## 2013-09-07 ENCOUNTER — Encounter: Payer: Self-pay | Admitting: Critical Care Medicine

## 2013-09-07 VITALS — BP 116/84 | HR 58 | Temp 97.6°F | Ht 60.0 in | Wt 239.2 lb

## 2013-09-07 DIAGNOSIS — R0609 Other forms of dyspnea: Secondary | ICD-10-CM

## 2013-09-07 DIAGNOSIS — G8929 Other chronic pain: Secondary | ICD-10-CM | POA: Insufficient documentation

## 2013-09-07 DIAGNOSIS — R0600 Dyspnea, unspecified: Secondary | ICD-10-CM

## 2013-09-07 DIAGNOSIS — G309 Alzheimer's disease, unspecified: Secondary | ICD-10-CM

## 2013-09-07 DIAGNOSIS — R0989 Other specified symptoms and signs involving the circulatory and respiratory systems: Secondary | ICD-10-CM

## 2013-09-07 DIAGNOSIS — R51 Headache: Secondary | ICD-10-CM

## 2013-09-07 DIAGNOSIS — M797 Fibromyalgia: Secondary | ICD-10-CM | POA: Insufficient documentation

## 2013-09-07 DIAGNOSIS — F0280 Dementia in other diseases classified elsewhere without behavioral disturbance: Secondary | ICD-10-CM | POA: Insufficient documentation

## 2013-09-07 NOTE — Progress Notes (Signed)
Subjective:    Patient ID: Christie Sanders, female    DOB: 06/25/1945, 68 y.o.   MRN: [id number] Chief Complaint  Patient presents with  . Pulmonary Consult    Ref. by Dr. Thurston Holeana Collins-sob x 2 yrs. getting worse,dry cough has bad spells,wakes up at night,midchest tightness,,occass. wheezning    HPI Comments: Dyspnea and dry cough x 2 yrs Progressively worse. No oxygen.  No pfts yet  Shortness of Breath This is a chronic problem. The current episode started more than 1 year ago. The problem occurs daily (notes dyspnea at rest and exertion.  minimal activity). The problem has been rapidly worsening. Associated symptoms include chest pain, headaches, leg pain, leg swelling, PND and wheezing. Pertinent negatives include no abdominal pain, claudication, coryza, ear pain, fever, hemoptysis, orthopnea, rhinorrhea, sore throat, sputum production, swollen glands, syncope or vomiting. The symptoms are aggravated by any activity, eating, exercise, emotional upset, odors, fumes and weather changes. Associated symptoms comments: Cough is dry and is long term Pain in back and anterior, pressure . Risk factors: passive smoke exposure only. She has tried nothing for the symptoms. Her past medical history is significant for allergies. There is no history of aspirin allergies, asthma, bronchiolitis, CAD, chronic lung disease, COPD, DVT, a heart failure, PE or pneumonia.   Past Medical History  Diagnosis Date  . Hypertension   . High cholesterol   . Seasonal allergies   . Chronic headaches   . Alzheimer disease 2005  . Fibromyalgia      Family History  Problem Relation Age of Onset  . Emphysema Sister     living  . Allergies Sister     living  . Heart disease Sister     living  . Lung cancer Sister     living  . Lung cancer Brother     deceased  . Aneurysm Brother     deceased     History   Social History  . Marital Status: Married    Spouse Name: N/A    Number of Children: N/A  . Years  of Education: N/A   Occupational History  . retired      Press photographertextile work   Social History Main Topics  . Smoking status: Never Smoker   . Smokeless tobacco: Never Used  . Alcohol Use: Not on file  . Drug Use: Not on file  . Sexual Activity: Not on file   Other Topics Concern  . Not on file   Social History Narrative   Lives alone   Across from daughter   Husband deceased     Allergies  Allergen Reactions  . Codeine     REACTION: swellling     No outpatient prescriptions prior to visit.   No facility-administered medications prior to visit.       Review of Systems  Constitutional: Negative for fever.  HENT: Positive for postnasal drip and trouble swallowing. Negative for ear pain, rhinorrhea, sneezing, sore throat and voice change.   Respiratory: Positive for cough, chest tightness, shortness of breath and wheezing. Negative for hemoptysis, sputum production and choking.   Cardiovascular: Positive for chest pain, leg swelling and PND. Negative for orthopnea, claudication and syncope.  Gastrointestinal: Negative for vomiting and abdominal pain.       No heartburn issues  Neurological: Positive for headaches.       Objective:   Physical Exam Filed Vitals:   09/07/13 1038  BP: 116/84  Pulse: 58  Temp: 97.6 F (36.4 C)  TempSrc: Oral  Height: 5' (1.524 m)  Weight: 239 lb 3.2 oz (108.5 kg)  SpO2: 96%    Gen: Pleasant,obese  in no distress,  normal affect  ENT: No lesions,  mouth clear,  oropharynx clear, no postnasal drip  Neck: No JVD, no TMG, no carotid bruits  Lungs: No use of accessory muscles, no dullness to percussion, clear without rales or rhonchi  Cardiovascular: RRR, heart sounds normal, no murmur or gallops, no peripheral edema  Abdomen: soft and NT, no HSM,  BS normal  Musculoskeletal: No deformities, no cyanosis or clubbing  Neuro: alert, non focal  Skin: Warm, no lesions or rashes  No results found.  No desat with exertion, pos  chest pain with exertion Cleda DaubSpiro 09/07/2013: Normal spirometry        Assessment & Plan:   DYSPNEA Dyspnea unclear etiology.  No evidence for primary lung disease. This was the case with 2011 Consult with my partner R Vassie LollAlva. Spirometry, walk test and CXR all normal. ?cardiac disease with ambulatory exertional chest pain Plan No further pulmonary work up Consider referral to cardiology again Return as needed to pulmonary    Updated Medication List Outpatient Encounter Prescriptions as of 09/07/2013  Medication Sig  . ALPRAZolam (XANAX) 0.5 MG tablet Take 0.5 mg by mouth 2 (two) times daily as needed for anxiety.  Marland Kitchen. aspirin EC 81 MG tablet Take 81 mg by mouth daily.  . bisoprolol-hydrochlorothiazide (ZIAC) 5-6.25 MG per tablet Take 1 tablet by mouth daily.  . cyclobenzaprine (FLEXERIL) 10 MG tablet Take 10 mg by mouth 3 (three) times daily.  Marland Kitchen. donepezil (ARICEPT) 10 MG tablet Take 10 mg by mouth at bedtime.  . hydrOXYzine (ATARAX/VISTARIL) 25 MG tablet Take 25 mg by mouth every 6 (Jessabelle Markiewicz) hours as needed.  . memantine (NAMENDA) 10 MG tablet Take 10 mg by mouth 2 (two) times daily.  Marland Kitchen. omeprazole (PRILOSEC) 20 MG capsule Take 20 mg by mouth. Take 2 capsules daily by mouth  . pravastatin (PRAVACHOL) 40 MG tablet Take 40 mg by mouth. Take 2 tablets daily  . sertraline (ZOLOFT) 100 MG tablet Take 100 mg by mouth daily.  Marland Kitchen. venlafaxine (EFFEXOR) 75 MG tablet Take 75 mg by mouth 3 (three) times daily with meals.

## 2013-09-07 NOTE — Patient Instructions (Signed)
You do not have any primary lung disease Consider another visit to cardiology for another stress test No change in medications Return as needed

## 2013-09-07 NOTE — Assessment & Plan Note (Signed)
Dyspnea unclear etiology.  No evidence for primary lung disease. This was the case with 2011 Consult with my partner R Vassie LollAlva. Spirometry, walk test and CXR all normal. ?cardiac disease with ambulatory exertional chest pain Plan No further pulmonary work up Consider referral to cardiology again Return as needed to pulmonary

## 2013-11-23 ENCOUNTER — Other Ambulatory Visit: Payer: Self-pay | Admitting: Family Medicine

## 2013-11-23 DIAGNOSIS — Z1231 Encounter for screening mammogram for malignant neoplasm of breast: Secondary | ICD-10-CM

## 2013-12-02 ENCOUNTER — Ambulatory Visit
Admission: RE | Admit: 2013-12-02 | Discharge: 2013-12-02 | Disposition: A | Discharge status: Home / Self Care | Payer: Medicare HMO | Source: Ambulatory Visit | Attending: Family Medicine | Admitting: Family Medicine

## 2013-12-02 DIAGNOSIS — Z1231 Encounter for screening mammogram for malignant neoplasm of breast: Secondary | ICD-10-CM

## 2015-02-13 ENCOUNTER — Other Ambulatory Visit: Payer: Self-pay

## 2015-02-13 DIAGNOSIS — Z1231 Encounter for screening mammogram for malignant neoplasm of breast: Secondary | ICD-10-CM

## 2015-03-09 ENCOUNTER — Ambulatory Visit
Admission: RE | Admit: 2015-03-09 | Discharge: 2015-03-09 | Disposition: A | Discharge status: Home / Self Care | Payer: PPO | Source: Ambulatory Visit

## 2015-03-09 DIAGNOSIS — Z1231 Encounter for screening mammogram for malignant neoplasm of breast: Secondary | ICD-10-CM

## 2015-03-14 ENCOUNTER — Other Ambulatory Visit: Payer: Self-pay | Admitting: Family Medicine

## 2015-03-14 DIAGNOSIS — R928 Other abnormal and inconclusive findings on diagnostic imaging of breast: Secondary | ICD-10-CM

## 2015-03-24 ENCOUNTER — Ambulatory Visit
Admission: RE | Admit: 2015-03-24 | Discharge: 2015-03-24 | Disposition: A | Discharge status: Home / Self Care | Payer: PPO | Source: Ambulatory Visit | Attending: Family Medicine | Admitting: Family Medicine

## 2015-03-24 ENCOUNTER — Other Ambulatory Visit: Payer: Self-pay | Admitting: Family Medicine

## 2015-03-24 DIAGNOSIS — R928 Other abnormal and inconclusive findings on diagnostic imaging of breast: Secondary | ICD-10-CM

## 2015-08-23 ENCOUNTER — Other Ambulatory Visit: Payer: Self-pay | Admitting: Family Medicine

## 2015-08-23 DIAGNOSIS — R921 Mammographic calcification found on diagnostic imaging of breast: Secondary | ICD-10-CM

## 2015-12-06 ENCOUNTER — Other Ambulatory Visit: Payer: Self-pay | Admitting: *Deleted

## 2015-12-06 NOTE — Telephone Encounter (Signed)
Refill request received from [facility] requesting mometasone furoate 0.1% cream denied needs new patient office visit last visit 02/24/2012. Faxed back to pharmacy at [phone].

## 2016-02-08 ENCOUNTER — Other Ambulatory Visit: Payer: Self-pay | Admitting: Family Medicine

## 2016-02-08 DIAGNOSIS — Z1231 Encounter for screening mammogram for malignant neoplasm of breast: Secondary | ICD-10-CM

## 2016-03-11 ENCOUNTER — Ambulatory Visit
Admission: RE | Admit: 2016-03-11 | Discharge: 2016-03-11 | Disposition: A | Discharge status: Home / Self Care | Payer: PPO | Source: Ambulatory Visit | Attending: Family Medicine | Admitting: Family Medicine

## 2016-03-11 DIAGNOSIS — Z1231 Encounter for screening mammogram for malignant neoplasm of breast: Secondary | ICD-10-CM

## 2020-04-21 DIAGNOSIS — I10 Essential (primary) hypertension: Secondary | ICD-10-CM | POA: Diagnosis not present

## 2020-05-17 DIAGNOSIS — K625 Hemorrhage of anus and rectum: Secondary | ICD-10-CM | POA: Diagnosis not present

## 2020-05-17 DIAGNOSIS — Z1211 Encounter for screening for malignant neoplasm of colon: Secondary | ICD-10-CM | POA: Diagnosis not present

## 2020-08-08 DIAGNOSIS — M25512 Pain in left shoulder: Secondary | ICD-10-CM | POA: Diagnosis not present

## 2020-08-08 DIAGNOSIS — M25511 Pain in right shoulder: Secondary | ICD-10-CM | POA: Diagnosis not present

## 2020-08-08 DIAGNOSIS — M19012 Primary osteoarthritis, left shoulder: Secondary | ICD-10-CM | POA: Diagnosis not present

## 2020-10-23 DIAGNOSIS — F0280 Dementia in other diseases classified elsewhere without behavioral disturbance: Secondary | ICD-10-CM | POA: Diagnosis not present

## 2020-10-23 DIAGNOSIS — E7800 Pure hypercholesterolemia, unspecified: Secondary | ICD-10-CM | POA: Diagnosis not present

## 2020-10-23 DIAGNOSIS — I10 Essential (primary) hypertension: Secondary | ICD-10-CM | POA: Diagnosis not present

## 2020-10-23 DIAGNOSIS — E559 Vitamin D deficiency, unspecified: Secondary | ICD-10-CM | POA: Diagnosis not present

## 2020-10-23 DIAGNOSIS — Z79899 Other long term (current) drug therapy: Secondary | ICD-10-CM | POA: Diagnosis not present

## 2020-10-23 DIAGNOSIS — F411 Generalized anxiety disorder: Secondary | ICD-10-CM | POA: Diagnosis not present

## 2020-10-23 DIAGNOSIS — G309 Alzheimer's disease, unspecified: Secondary | ICD-10-CM | POA: Diagnosis not present

## 2020-10-25 DIAGNOSIS — F411 Generalized anxiety disorder: Secondary | ICD-10-CM | POA: Diagnosis not present

## 2020-10-25 DIAGNOSIS — G309 Alzheimer's disease, unspecified: Secondary | ICD-10-CM | POA: Diagnosis not present

## 2020-10-25 DIAGNOSIS — E7800 Pure hypercholesterolemia, unspecified: Secondary | ICD-10-CM | POA: Diagnosis not present

## 2020-10-25 DIAGNOSIS — Z741 Need for assistance with personal care: Secondary | ICD-10-CM | POA: Diagnosis not present

## 2020-10-25 DIAGNOSIS — G8929 Other chronic pain: Secondary | ICD-10-CM | POA: Diagnosis not present

## 2020-10-25 DIAGNOSIS — I10 Essential (primary) hypertension: Secondary | ICD-10-CM | POA: Diagnosis not present

## 2020-10-25 DIAGNOSIS — F0280 Dementia in other diseases classified elsewhere without behavioral disturbance: Secondary | ICD-10-CM | POA: Diagnosis not present

## 2020-10-25 DIAGNOSIS — M25512 Pain in left shoulder: Secondary | ICD-10-CM | POA: Diagnosis not present

## 2020-11-23 DIAGNOSIS — R059 Cough, unspecified: Secondary | ICD-10-CM | POA: Diagnosis not present

## 2020-11-23 DIAGNOSIS — Z20822 Contact with and (suspected) exposure to covid-19: Secondary | ICD-10-CM | POA: Diagnosis not present

## 2021-03-23 DIAGNOSIS — H5203 Hypermetropia, bilateral: Secondary | ICD-10-CM | POA: Diagnosis not present

## 2021-03-23 DIAGNOSIS — H524 Presbyopia: Secondary | ICD-10-CM | POA: Diagnosis not present

## 2021-03-23 DIAGNOSIS — H52209 Unspecified astigmatism, unspecified eye: Secondary | ICD-10-CM | POA: Diagnosis not present

## 2021-04-03 DIAGNOSIS — R300 Dysuria: Secondary | ICD-10-CM | POA: Diagnosis not present

## 2021-04-26 ENCOUNTER — Encounter (HOSPITAL_BASED_OUTPATIENT_CLINIC_OR_DEPARTMENT_OTHER): Payer: Self-pay

## 2021-04-26 ENCOUNTER — Emergency Department (HOSPITAL_BASED_OUTPATIENT_CLINIC_OR_DEPARTMENT_OTHER): Payer: Medicare HMO

## 2021-04-26 ENCOUNTER — Other Ambulatory Visit: Payer: Self-pay

## 2021-04-26 ENCOUNTER — Emergency Department (HOSPITAL_BASED_OUTPATIENT_CLINIC_OR_DEPARTMENT_OTHER): Payer: Medicare HMO | Admitting: Radiology

## 2021-04-26 ENCOUNTER — Inpatient Hospital Stay (HOSPITAL_BASED_OUTPATIENT_CLINIC_OR_DEPARTMENT_OTHER)
Admission: EM | Admit: 2021-04-26 | Discharge: 2021-05-04 | DRG: 871 | Disposition: A | Discharge status: Skilled Nursing Facility | Payer: Medicare HMO | Attending: Internal Medicine | Admitting: Internal Medicine

## 2021-04-26 DIAGNOSIS — B9620 Unspecified Escherichia coli [E. coli] as the cause of diseases classified elsewhere: Secondary | ICD-10-CM | POA: Diagnosis not present

## 2021-04-26 DIAGNOSIS — N179 Acute kidney failure, unspecified: Secondary | ICD-10-CM | POA: Diagnosis present

## 2021-04-26 DIAGNOSIS — Z20822 Contact with and (suspected) exposure to covid-19: Secondary | ICD-10-CM | POA: Diagnosis not present

## 2021-04-26 DIAGNOSIS — Z9181 History of falling: Secondary | ICD-10-CM

## 2021-04-26 DIAGNOSIS — Z743 Need for continuous supervision: Secondary | ICD-10-CM | POA: Diagnosis not present

## 2021-04-26 DIAGNOSIS — A419 Sepsis, unspecified organism: Principal | ICD-10-CM

## 2021-04-26 DIAGNOSIS — J9601 Acute respiratory failure with hypoxia: Secondary | ICD-10-CM

## 2021-04-26 DIAGNOSIS — F0280 Dementia in other diseases classified elsewhere without behavioral disturbance: Secondary | ICD-10-CM | POA: Diagnosis present

## 2021-04-26 DIAGNOSIS — I10 Essential (primary) hypertension: Secondary | ICD-10-CM | POA: Diagnosis not present

## 2021-04-26 DIAGNOSIS — I5033 Acute on chronic diastolic (congestive) heart failure: Secondary | ICD-10-CM | POA: Diagnosis not present

## 2021-04-26 DIAGNOSIS — Z7982 Long term (current) use of aspirin: Secondary | ICD-10-CM

## 2021-04-26 DIAGNOSIS — Z885 Allergy status to narcotic agent status: Secondary | ICD-10-CM

## 2021-04-26 DIAGNOSIS — R6520 Severe sepsis without septic shock: Secondary | ICD-10-CM | POA: Diagnosis not present

## 2021-04-26 DIAGNOSIS — R531 Weakness: Secondary | ICD-10-CM | POA: Diagnosis not present

## 2021-04-26 DIAGNOSIS — Z825 Family history of asthma and other chronic lower respiratory diseases: Secondary | ICD-10-CM

## 2021-04-26 DIAGNOSIS — E669 Obesity, unspecified: Secondary | ICD-10-CM | POA: Diagnosis present

## 2021-04-26 DIAGNOSIS — I214 Non-ST elevation (NSTEMI) myocardial infarction: Secondary | ICD-10-CM | POA: Diagnosis not present

## 2021-04-26 DIAGNOSIS — Z79899 Other long term (current) drug therapy: Secondary | ICD-10-CM

## 2021-04-26 DIAGNOSIS — I110 Hypertensive heart disease with heart failure: Secondary | ICD-10-CM | POA: Diagnosis not present

## 2021-04-26 DIAGNOSIS — I4891 Unspecified atrial fibrillation: Secondary | ICD-10-CM

## 2021-04-26 DIAGNOSIS — M797 Fibromyalgia: Secondary | ICD-10-CM | POA: Diagnosis present

## 2021-04-26 DIAGNOSIS — N3000 Acute cystitis without hematuria: Secondary | ICD-10-CM

## 2021-04-26 DIAGNOSIS — Z6841 Body Mass Index (BMI) 40.0 and over, adult: Secondary | ICD-10-CM | POA: Diagnosis not present

## 2021-04-26 DIAGNOSIS — Z66 Do not resuscitate: Secondary | ICD-10-CM | POA: Diagnosis not present

## 2021-04-26 DIAGNOSIS — R4182 Altered mental status, unspecified: Secondary | ICD-10-CM

## 2021-04-26 DIAGNOSIS — E860 Dehydration: Secondary | ICD-10-CM | POA: Diagnosis not present

## 2021-04-26 DIAGNOSIS — K219 Gastro-esophageal reflux disease without esophagitis: Secondary | ICD-10-CM | POA: Diagnosis not present

## 2021-04-26 DIAGNOSIS — B001 Herpesviral vesicular dermatitis: Secondary | ICD-10-CM | POA: Diagnosis not present

## 2021-04-26 DIAGNOSIS — E876 Hypokalemia: Secondary | ICD-10-CM | POA: Diagnosis not present

## 2021-04-26 DIAGNOSIS — J9811 Atelectasis: Secondary | ICD-10-CM | POA: Diagnosis not present

## 2021-04-26 DIAGNOSIS — Z6838 Body mass index (BMI) 38.0-38.9, adult: Secondary | ICD-10-CM

## 2021-04-26 DIAGNOSIS — J918 Pleural effusion in other conditions classified elsewhere: Secondary | ICD-10-CM | POA: Diagnosis not present

## 2021-04-26 DIAGNOSIS — T796XXA Traumatic ischemia of muscle, initial encounter: Secondary | ICD-10-CM

## 2021-04-26 DIAGNOSIS — Z96652 Presence of left artificial knee joint: Secondary | ICD-10-CM | POA: Diagnosis present

## 2021-04-26 DIAGNOSIS — Z7401 Bed confinement status: Secondary | ICD-10-CM | POA: Diagnosis not present

## 2021-04-26 DIAGNOSIS — E871 Hypo-osmolality and hyponatremia: Secondary | ICD-10-CM | POA: Diagnosis not present

## 2021-04-26 DIAGNOSIS — R0600 Dyspnea, unspecified: Secondary | ICD-10-CM | POA: Diagnosis not present

## 2021-04-26 DIAGNOSIS — E7800 Pure hypercholesterolemia, unspecified: Secondary | ICD-10-CM | POA: Diagnosis present

## 2021-04-26 DIAGNOSIS — B9689 Other specified bacterial agents as the cause of diseases classified elsewhere: Secondary | ICD-10-CM | POA: Diagnosis present

## 2021-04-26 DIAGNOSIS — Z8249 Family history of ischemic heart disease and other diseases of the circulatory system: Secondary | ICD-10-CM

## 2021-04-26 DIAGNOSIS — G309 Alzheimer's disease, unspecified: Secondary | ICD-10-CM | POA: Diagnosis present

## 2021-04-26 DIAGNOSIS — R0602 Shortness of breath: Secondary | ICD-10-CM

## 2021-04-26 LAB — TROPONIN I (HIGH SENSITIVITY)
Troponin I (High Sensitivity): 72 ng/L — ABNORMAL HIGH (ref <18)
Troponin I (High Sensitivity): 67 ng/L — ABNORMAL HIGH (ref <18)

## 2021-04-26 LAB — CBC WITH DIFFERENTIAL/PLATELET
Abs Immature Granulocytes: 0.37 10*3/uL — ABNORMAL HIGH (ref 0.00–0.07)
Basophils Absolute: 0 10*3/uL (ref 0.0–0.1)
Basophils Relative: 0 %
Eosinophils Absolute: 0.1 10*3/uL (ref 0.0–0.5)
Eosinophils Relative: 0 %
HCT: 37 % (ref 36.0–46.0)
Hemoglobin: 12.6 g/dL (ref 12.0–15.0)
Immature Granulocytes: 2 %
Lymphocytes Relative: 6 %
Lymphs Abs: 1.1 10*3/uL (ref 0.7–4.0)
MCH: 31.6 pg (ref 26.0–34.0)
MCHC: 34.1 g/dL (ref 30.0–36.0)
MCV: 92.7 fL (ref 80.0–100.0)
Monocytes Absolute: 1.5 10*3/uL — ABNORMAL HIGH (ref 0.1–1.0)
Monocytes Relative: 8 %
Neutro Abs: 15.3 10*3/uL — ABNORMAL HIGH (ref 1.7–7.7)
Neutrophils Relative %: 84 %
Platelets: 193 10*3/uL (ref 150–400)
RBC: 3.99 MIL/uL (ref 3.87–5.11)
RDW: 13.2 % (ref 11.5–15.5)
WBC: 18.4 10*3/uL — ABNORMAL HIGH (ref 4.0–10.5)
nRBC: 0 % (ref 0.0–0.2)

## 2021-04-26 LAB — RESP PANEL BY RT-PCR (FLU A&B, COVID) ARPGX2
Influenza A by PCR: NEGATIVE
Influenza B by PCR: NEGATIVE
SARS Coronavirus 2 by RT PCR: NEGATIVE

## 2021-04-26 LAB — BASIC METABOLIC PANEL
Anion gap: 13 (ref 5–15)
BUN: 23 mg/dL (ref 8–23)
CO2: 24 mmol/L (ref 22–32)
Calcium: 9.3 mg/dL (ref 8.9–10.3)
Chloride: 91 mmol/L — ABNORMAL LOW (ref 98–111)
Creatinine, Ser: 1.06 mg/dL — ABNORMAL HIGH (ref 0.44–1.00)
GFR, Estimated: 54 mL/min — ABNORMAL LOW (ref >60)
Glucose, Bld: 130 mg/dL — ABNORMAL HIGH (ref 70–99)
Potassium: 2.7 mmol/L — CL (ref 3.5–5.1)
Sodium: 128 mmol/L — ABNORMAL LOW (ref 135–145)

## 2021-04-26 LAB — URINALYSIS, ROUTINE W REFLEX MICROSCOPIC
Bilirubin Urine: NEGATIVE
Glucose, UA: NEGATIVE mg/dL
Nitrite: NEGATIVE
Protein, ur: 100 mg/dL — AB
Specific Gravity, Urine: 1.014 (ref 1.005–1.030)
pH: 6 (ref 5.0–8.0)

## 2021-04-26 LAB — LACTIC ACID, PLASMA
Lactic Acid, Venous: 3.4 mmol/L (ref 0.5–1.9)
Lactic Acid, Venous: 2.1 mmol/L (ref 0.5–1.9)

## 2021-04-26 LAB — CK: Total CK: 542 U/L — ABNORMAL HIGH (ref 38–234)

## 2021-04-26 LAB — MAGNESIUM: Magnesium: 1.4 mg/dL — ABNORMAL LOW (ref 1.7–2.4)

## 2021-04-26 LAB — BRAIN NATRIURETIC PEPTIDE: B Natriuretic Peptide: 221.7 pg/mL — ABNORMAL HIGH (ref 0.0–100.0)

## 2021-04-26 MED ORDER — SODIUM CHLORIDE 0.9 % IV SOLN
2.0000 g | Freq: Two times a day (BID) | INTRAVENOUS | Status: DC
  Administered 2021-04-26: 2 g via INTRAVENOUS
  Filled 2021-04-26: qty 2

## 2021-04-26 MED ORDER — HEPARIN BOLUS VIA INFUSION
4000.0000 [IU] | Freq: Once | INTRAVENOUS | Status: AC
  Administered 2021-04-26: 4000 [IU] via INTRAVENOUS

## 2021-04-26 MED ORDER — SODIUM CHLORIDE 0.9 % IV BOLUS
1000.0000 mL | Freq: Once | INTRAVENOUS | Status: AC
  Administered 2021-04-26: 1000 mL via INTRAVENOUS

## 2021-04-26 MED ORDER — ONDANSETRON HCL 4 MG/2ML IJ SOLN
4.0000 mg | Freq: Once | INTRAMUSCULAR | Status: DC
  Filled 2021-04-26: qty 2

## 2021-04-26 MED ORDER — ASPIRIN 81 MG PO CHEW
324.0000 mg | CHEWABLE_TABLET | Freq: Once | ORAL | Status: AC
  Administered 2021-04-26: 324 mg via ORAL
  Filled 2021-04-26: qty 4

## 2021-04-26 MED ORDER — VANCOMYCIN HCL IN DEXTROSE 1-5 GM/200ML-% IV SOLN
1000.0000 mg | Freq: Once | INTRAVENOUS | Status: AC
  Administered 2021-04-26: 1000 mg via INTRAVENOUS
  Filled 2021-04-26: qty 200

## 2021-04-26 MED ORDER — METHYLPREDNISOLONE SODIUM SUCC 125 MG IJ SOLR
60.0000 mg | Freq: Once | INTRAMUSCULAR | Status: AC
  Administered 2021-04-26: 60 mg via INTRAVENOUS
  Filled 2021-04-26: qty 2

## 2021-04-26 MED ORDER — FUROSEMIDE 10 MG/ML IJ SOLN
20.0000 mg | Freq: Once | INTRAMUSCULAR | Status: AC
  Administered 2021-04-26: 20 mg via INTRAVENOUS
  Filled 2021-04-26: qty 2

## 2021-04-26 MED ORDER — SODIUM CHLORIDE 0.9 % IV SOLN
1.0000 g | Freq: Once | INTRAVENOUS | Status: AC
  Administered 2021-04-26: 1 g via INTRAVENOUS
  Filled 2021-04-26: qty 10

## 2021-04-26 MED ORDER — LACTATED RINGERS IV SOLN: INTRAVENOUS | Status: DC

## 2021-04-26 MED ORDER — HEPARIN (PORCINE) 25000 UT/250ML-% IV SOLN
1400.0000 [IU]/h | INTRAVENOUS | Status: DC
  Administered 2021-04-26: 1000 [IU]/h via INTRAVENOUS
  Administered 2021-04-27: 1200 [IU]/h via INTRAVENOUS
  Administered 2021-04-28: 1400 [IU]/h via INTRAVENOUS
  Filled 2021-04-26 (×3): qty 250

## 2021-04-26 MED ORDER — LACTATED RINGERS IV BOLUS
1000.0000 mL | Freq: Once | INTRAVENOUS | Status: AC
  Administered 2021-04-26: 1000 mL via INTRAVENOUS

## 2021-04-26 MED ORDER — MAGNESIUM SULFATE 2 GM/50ML IV SOLN
2.0000 g | Freq: Once | INTRAVENOUS | Status: AC
Start: 2021-04-26 — End: 2021-04-26
  Administered 2021-04-26: 2 g via INTRAVENOUS
  Filled 2021-04-26: qty 50

## 2021-04-26 MED ORDER — DILTIAZEM HCL-DEXTROSE 125-5 MG/125ML-% IV SOLN (PREMIX)
5.0000 mg/h | INTRAVENOUS | Status: DC
  Administered 2021-04-26 – 2021-04-27 (×2): 5 mg/h via INTRAVENOUS
  Filled 2021-04-26 (×3): qty 125

## 2021-04-26 MED ORDER — POTASSIUM CHLORIDE 10 MEQ/100ML IV SOLN
10.0000 meq | INTRAVENOUS | Status: AC
  Administered 2021-04-26 (×5): 10 meq via INTRAVENOUS
  Filled 2021-04-26 (×4): qty 100

## 2021-04-26 MED ORDER — VANCOMYCIN HCL 1250 MG/250ML IV SOLN
1250.0000 mg | INTRAVENOUS | Status: DC
  Filled 2021-04-26: qty 250

## 2021-04-26 NOTE — Sepsis Progress Note (Signed)
eLink monitoring code sepsis.  

## 2021-04-26 NOTE — ED Triage Notes (Signed)
Patient here POV from Home with Family for Multiple Complaints.  Patient states she fell approximately 1 day PTA getting OOB going to the [employer]. Patient complaining of Soreness to Extremities and possibly hit her Head.   Patient also endorses UTI Symptoms (Burning with Urination) for 3 weeks. Patient was prescribed Antibiotics but did not take them due to feeling better.  Also endorses Ear Fullness/Pain approximately 2 days PTA.  NAD Noted during Triage. A&Ox4. GCS 15. BIB Wheelchair.

## 2021-04-26 NOTE — ED Provider Notes (Addendum)
Provider Note MRN:  [id number]  Arrival date & time: 04/26/21    ED Course and Medical Decision Making  Assumed care from Dr Pearline Cables at shift change.  See not from prior team for complete details, in brief: 76 year old female with weakness, fall with possible LOC.  GU complaints, diagnosed with UTI but not take antibiotics.  Patient was found down yesterday by family, downtime approximately 3 hours.  Patient with poor recollection of the event.  Patient found to have urinalysis just with UTI, urine culture sent.  Leukocytosis greater than 18.  Lactic acid 3.4.  AMS, tachycardia.  Concern for severe sepsis.  Blood cultures and urine culture sent. Broad spectrum antibiotics.  Fluid resuscitation started.  Chest x-ray stable.  Patient tachypneic, no hypoxia.  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  No current home nasal cannula use.  She is placed on 2 L nasal cannula. CXR neg.   Patient found to have NSTEMI.  Give aspirin.  Continue to trend troponin.  She appears to be in new onset atrial fibrillation.  CT head was negative.  Start heparin infusion in setting of A. fib and NSTEMI.  CPK elevated, fall yesterday, concern for rhabdomyolysis, continue IVF.   Patient found hypokalemia and hypomagnesemia.  Magnesium replacement and past replacement ordered.  IV fluids.  Dehydration.   Recommend admission for the above.  Patient and family agreeable  D/w hospitalist, requests starting rate/rhythm control medications for afib; will start cardiazem infusion. Accepted to progressive bed. She is HDS currently. HR 110-120   ADDENDUM >>> Pt with worsening work of breathing, tachypnea noted, diaphoretic, she is not hypoxic. Will repeat CXR and place pt on BIPAP. Resp status greatly improved on BIPAP, will continue. HDS.   .Critical Care Performed by: Jeanell Sparrow, DO Authorized by: Jeanell Sparrow, DO   Critical care provider statement:    Critical care time (minutes):   75   Critical care time was exclusive of:  Separately billable procedures and treating other patients   Critical care was necessary to treat or prevent imminent or life-threatening deterioration of the following conditions:  Cardiac failure, respiratory failure, sepsis and endocrine crisis   Critical care was time spent personally by me on the following activities:  Development of treatment plan with patient or surrogate, discussions with consultants, evaluation of patient's response to treatment, examination of patient, ordering and review of laboratory studies, ordering and review of radiographic studies, ordering and performing treatments and interventions, pulse oximetry, re-evaluation of patient's condition and review of old charts   Care discussed with: admitting provider   Comments:     NSTEMI, critical hypokalemia, sepsis  Final Clinical Impressions(s) / ED Diagnoses     ICD-10-CM   1. Acute cystitis without hematuria  N30.00     2. Altered mental status, unspecified altered mental status type  R41.82     3. Sepsis, due to unspecified organism, unspecified whether acute organ dysfunction present ([place])  A41.9     4. Hypokalemia  E87.6     5. Hypomagnesemia  E83.42     6. NSTEMI (non-ST elevated myocardial infarction) ([place])  I21.4     7. Traumatic rhabdomyolysis, initial encounter ([place])  T79.6XXA     8. Atrial fibrillation with RVR [facility])  I48.91       ED Discharge Orders     None       Discharge Instructions   None         Jeanell Sparrow,  DO 04/26/21 1736    Jeanell Sparrow, DO 04/26/21 1737    Jeanell Sparrow, DO 04/26/21 2135

## 2021-04-26 NOTE — Progress Notes (Signed)
Pharmacy Antibiotic Note  Christie Sanders is a 76 y.o. female admitted on 04/26/2021 with sepsis.  Pharmacy has been consulted for Cefepime and vancomycin dosing.  WBC and LA elevated.  Plan: -Cefepime 2 gm IV Q 12 hours -Vancomycin 2 gm IV load followed by Vancomycin 1250 mg IV Q 24 hrs. Goal AUC 400-550. Expected AUC: 436 SCr used: 1.06 -Monitor CBC, renal fx, cultures and clinical progress -Vanc levels as indicated    Height: 5' (152.4 cm) Weight: 108.5 kg (239 lb 3.2 oz) IBW/kg (Calculated) : 45.5  Temp (24hrs), Avg:98.8 F (37.1 C), Min:98.8 F (37.1 C), Max:98.8 F (37.1 C)  Recent Labs  Lab 04/26/21 1412 04/26/21 1555  WBC 18.4*  --   CREATININE 1.06*  --   LATICACIDVEN  --  3.4*    Estimated Creatinine Clearance: 50.4 mL/min (A) (by C-G formula based on SCr of 1.06 mg/dL (H)).    Allergies  Allergen Reactions   Codeine     REACTION: swellling    Antimicrobials this admission: Cefepime 2/2 >>  Vancomycin 2/2 >>   Dose adjustments this admission:   Microbiology results: 2/2 BCx:  2/2 UCx:     Thank you for allowing pharmacy to be a part of this patients care.  Albertina Parr, PharmD., BCCCP Clinical Pharmacist Please refer to [facility] for unit-specific pharmacist

## 2021-04-26 NOTE — ED Provider Notes (Signed)
[name] Provider Note   CSN: [id number] Arrival date & time: 04/26/21  1132     History  Chief Complaint  Patient presents with   Multiple Complaints    Christie Sanders is a 76 y.o. female.  Patient is a 37 is a hypertension, hyperlipidemia, and dementia presenting for generalized weakness, fall with head trauma and possible loc, and dysuria. Pt admits to dysuria and increased frequency. Son at bedside admits to worsening confusion from baseline. Hx of recent dx of UTI but did not take antibiotics bc symptoms relieved with over the counter medications. Pt was found on the floor yesterday, down for approx 3 hours. Pt unable to say whether she tripped and fell or had loc. Denies chest pain or sob. Unknown head trauma. No hx of blood thinner use.   The history is provided by the patient. No language interpreter was used.      Home Medications Prior to Admission medications   Medication Sig Start Date End Date Taking? Authorizing Provider  ALPRAZolam Duanne Moron) 0.5 MG tablet Take 0.5 mg by mouth 2 (two) times daily as needed for anxiety.    [provider]  aspirin EC 81 MG tablet Take 81 mg by mouth daily.    [provider]  bisoprolol-hydrochlorothiazide (ZIAC) 5-6.25 MG per tablet Take 1 tablet by mouth daily.    [provider]  cyclobenzaprine (FLEXERIL) 10 MG tablet Take 10 mg by mouth 3 (three) times daily.    [provider]  donepezil (ARICEPT) 10 MG tablet Take 10 mg by mouth at bedtime.    [provider]  hydrOXYzine (ATARAX/VISTARIL) 25 MG tablet Take 25 mg by mouth every 6 (six) hours as needed.    [provider]  memantine (NAMENDA) 10 MG tablet Take 10 mg by mouth 2 (two) times daily.    [provider]  omeprazole (PRILOSEC) 20 MG capsule Take 20 mg by mouth. Take 2 capsules daily by mouth    [provider]  pravastatin (PRAVACHOL) 40 MG tablet Take 40 mg by mouth.  Take 2 tablets daily    [provider]  sertraline (ZOLOFT) 100 MG tablet Take 100 mg by mouth daily.    [provider]  venlafaxine (EFFEXOR) 75 MG tablet Take 75 mg by mouth 3 (three) times daily with meals.    [provider]      Allergies    Codeine    Review of Systems   Review of Systems  Constitutional:  Negative for chills and fever.  HENT:  Negative for ear pain and sore throat.   Eyes:  Negative for pain and visual disturbance.  Respiratory:  Negative for cough and shortness of breath.   Cardiovascular:  Negative for chest pain and palpitations.  Gastrointestinal:  Positive for abdominal pain. Negative for vomiting.  Genitourinary:  Positive for dysuria. Negative for hematuria.  Musculoskeletal:  Negative for arthralgias and back pain.  Skin:  Negative for color change and rash.  Neurological:  Negative for seizures and syncope.  Psychiatric/Behavioral:  Positive for confusion.   All other systems reviewed and are negative.  Physical Exam Updated Vital Signs BP (!) 134/112    Pulse (!) 110    Temp 98.8 F (37.1 C) (Oral)    Resp (!) 24    Ht 5' (1.524 m)    Wt 108.5 kg    SpO2 94%    BMI 46.72 kg/m  Physical Exam Vitals and nursing note reviewed.  Constitutional:      General: She is not in acute distress.    Appearance: She is well-developed.  HENT:     Head: Normocephalic and atraumatic.  Eyes:     Conjunctiva/sclera: Conjunctivae normal.  Cardiovascular:     Rate and Rhythm: Normal rate and regular rhythm.     Heart sounds: No murmur heard. Pulmonary:     Effort: Pulmonary effort is normal. No respiratory distress.     Breath sounds: Normal breath sounds.  Abdominal:     Palpations: Abdomen is soft.     Tenderness: There is abdominal tenderness in the suprapubic area.  Musculoskeletal:        General: No swelling.     Cervical back: Neck supple.  Skin:    General: Skin is warm and dry.     Capillary Refill: Capillary refill  takes less than 2 seconds.  Neurological:     Mental Status: She is alert and oriented to person, place, and time.     GCS: GCS eye subscore is 4. GCS verbal subscore is 5. GCS motor subscore is 6.     Gait: Gait abnormal.  Psychiatric:        Mood and Affect: Mood normal.    ED Results / Procedures / Treatments   Labs (all labs ordered are listed, but only abnormal results are displayed) Labs Reviewed  URINALYSIS, ROUTINE W REFLEX MICROSCOPIC    EKG None  Radiology DG Chest 2 View  Result Date: 04/26/2021 CLINICAL DATA:  Shortness of breath. EXAM: CHEST - 2 VIEW COMPARISON:  Chest CTA dated 11/12/2017. Chest radiographs dated 11/02/2014. FINDINGS: Interval borderline enlarged heart. Tortuous aorta. Interval small amount of pleural fluid or thickening at the left lateral costophrenic angle. Minimal atelectasis or scarring at the right lateral costophrenic angle. Lumbar spine fixation hardware. Thoracic spine degenerative changes. IMPRESSION: 1. Interval borderline cardiomegaly. 2. Interval small amount of left pleural fluid or thickening. 3. Interval minimal atelectasis or scarring at the right lateral costophrenic angle. Electronically Signed   By: Claudie Revering M.D.   On: 04/26/2021 12:48    Procedures Procedures    Medications Ordered in ED Medications - No data to display  ED Course/ Medical Decision Making/ A&P                           Medical Decision Making Amount and/or Complexity of Data Reviewed Labs: ordered. Radiology: ordered.  Risk Prescription drug management. Decision regarding hospitalization.   2:01 PM 75 is a hypertension, hyperlipidemia, and dementia presenting for generalized weakness, fall with head trauma and possible loc, and dysuria. Pt currently Aox2, confused, no acute distress, afebrile, with stable vitals. Physical exam demonstrates no focal deficits.   Urine positive for UTI. Rocephin given.  Stable ECG with no ST segment elevation or  depression cardiac workup pending.   Pt signed out to oncoming physician while awaiting rest of imaging/labs. Plan for admission due to profound weakness resulting in inability to ambulate. Family does not feel safe taking patient home at this time.           Final Clinical Impression(s) / ED Diagnoses Final diagnoses:  Acute cystitis without hematuria  Generalized weakness  Impaired ambulation  Altered mental status, unspecified altered mental status type    Rx / DC Orders ED Discharge Orders     None         Lianne Cure, DO [id number]

## 2021-04-26 NOTE — ED Notes (Signed)
CRITICAL VALUE STICKER  CRITICAL VALUE:  RECEIVER (on-site recipient of call):madison fountain, RN  DATE & TIME NOTIFIED: 04/26/2021 1704  MESSENGER (representative from lab):  MD NOTIFIED: gray,samuel  TIME OF NOTIFICATION:1704  RESPONSE:

## 2021-04-26 NOTE — Progress Notes (Signed)
Patient is a transfer from [facility] to [facility] Briefly, 76 year old female with a history of hypertension, hyperlipidemia, dementia, history of asbestos exposure, morbid obesity, presents with generalized weakness, fall with possible LOC, dysuria was brought in by family as they found her down in her home.  In the ED, patient found to have severe sepsis likely source UTI.  Sepsis work-up in progress.  Patient was started on IV fluids and ceftriaxone, advised EDP to switch to broad-spectrum antibiotics.  Patient also found to have mildly elevated troponin with flat trend, EKG with no acute ST changes.  Patient noted to be also in A. fib, with RVR likely new onset due to possibly sepsis, started on heparin drip and Cardizem infusion (with close monitoring of BP), noted CPK elevated concern for rhabdomyolysis on IV fluids. CT head negative. Pt is HD stable. Accepted to the progressive floor, with cardiac monitoring.

## 2021-04-26 NOTE — ED Notes (Signed)
Called [facility] to transport patient to [facility]  5W room 36 

## 2021-04-26 NOTE — Progress Notes (Signed)
ANTICOAGULATION CONSULT NOTE - Initial Consult  Pharmacy Consult for heparin Indication: chest pain/ACS and atrial fibrillation  Allergies  Allergen Reactions   Codeine     REACTION: swellling    Patient Measurements: Height: 5' (152.4 cm) Weight: 108.5 kg (239 lb 3.2 oz) IBW/kg (Calculated) : 45.5 Heparin Dosing Weight: 72kg  Vital Signs: Temp: 98.8 F (37.1 C) (02/02 1144) Temp Source: Oral (02/02 1144) BP: 108/98 (02/02 1634) Pulse Rate: 112 (02/02 1634)  Labs: Recent Labs    04/26/21 1412  HGB 12.6  HCT 37.0  PLT 193  CREATININE 1.06*  CKTOTAL 542*  TROPONINIHS 72*    Estimated Creatinine Clearance: 50.4 mL/min (A) (by C-G formula based on SCr of 1.06 mg/dL (H)).   Medical History: Past Medical History:  Diagnosis Date   Alzheimer disease ([place]) 2005   Chronic headaches    Fibromyalgia    High cholesterol    Hypertension    Seasonal allergies       Assessment: 76 yo W in atrial fibrillation and NSTEMI. No anticoagulation prior to admission per chart review. Pharmacy consulted for heparin.    H/H , plt wnl. Troponin 72.   Goal of Therapy:  Heparin level 0.3-0.7 units/ml Monitor platelets by anticoagulation protocol: Yes   Plan:  Heparin 4000 unit bolus then 1000 units/hr F/u 6hr HL Monitor daily heparin level, CBC Monitor for signs/symptoms of bleeding    Benetta Spar, PharmD, BCPS, BCCP Clinical Pharmacist  Please check AMION for all [place] phone numbers After 10:00 PM, call [facility]

## 2021-04-26 NOTE — ED Notes (Signed)
Patient transported to CT 

## 2021-04-26 NOTE — Progress Notes (Signed)
Patient arrived to [phone].  Assessment complete, VS obtained, and Admission database began.

## 2021-04-26 NOTE — ED Notes (Signed)
RT placed pt on BIPAP per order for SOB/increased WOB. Pt tolerating well at this time. MD ordered lasix at this time as well. RT called report to receiving RT for [facility] 5W36. Pt settings as follows: BIPAP/PSV 10/5 BUR16, 40%. Pt respiratory status stable w/moderate distress at this time.RT will continue to monitor.

## 2021-04-27 ENCOUNTER — Inpatient Hospital Stay (HOSPITAL_COMMUNITY): Payer: Medicare HMO

## 2021-04-27 DIAGNOSIS — I4891 Unspecified atrial fibrillation: Secondary | ICD-10-CM

## 2021-04-27 DIAGNOSIS — R6520 Severe sepsis without septic shock: Secondary | ICD-10-CM

## 2021-04-27 DIAGNOSIS — A419 Sepsis, unspecified organism: Principal | ICD-10-CM

## 2021-04-27 DIAGNOSIS — N3000 Acute cystitis without hematuria: Secondary | ICD-10-CM

## 2021-04-27 DIAGNOSIS — J9601 Acute respiratory failure with hypoxia: Secondary | ICD-10-CM

## 2021-04-27 DIAGNOSIS — F0280 Dementia in other diseases classified elsewhere without behavioral disturbance: Secondary | ICD-10-CM

## 2021-04-27 DIAGNOSIS — E871 Hypo-osmolality and hyponatremia: Secondary | ICD-10-CM

## 2021-04-27 DIAGNOSIS — I10 Essential (primary) hypertension: Secondary | ICD-10-CM

## 2021-04-27 DIAGNOSIS — G309 Alzheimer's disease, unspecified: Secondary | ICD-10-CM

## 2021-04-27 LAB — CBC WITH DIFFERENTIAL/PLATELET
Abs Immature Granulocytes: 0 10*3/uL (ref 0.00–0.07)
Basophils Absolute: 0 10*3/uL (ref 0.0–0.1)
Basophils Relative: 0 %
Eosinophils Absolute: 0 10*3/uL (ref 0.0–0.5)
Eosinophils Relative: 0 %
HCT: 36 % (ref 36.0–46.0)
Hemoglobin: 12.5 g/dL (ref 12.0–15.0)
Lymphocytes Relative: 2 %
Lymphs Abs: 0.4 10*3/uL — ABNORMAL LOW (ref 0.7–4.0)
MCH: 32.6 pg (ref 26.0–34.0)
MCHC: 34.7 g/dL (ref 30.0–36.0)
MCV: 94 fL (ref 80.0–100.0)
Monocytes Absolute: 0.2 10*3/uL (ref 0.1–1.0)
Monocytes Relative: 1 %
Neutro Abs: 18.2 10*3/uL — ABNORMAL HIGH (ref 1.7–7.7)
Neutrophils Relative %: 97 %
Platelets: 148 10*3/uL — ABNORMAL LOW (ref 150–400)
RBC: 3.83 MIL/uL — ABNORMAL LOW (ref 3.87–5.11)
RDW: 13.2 % (ref 11.5–15.5)
WBC: 18.8 10*3/uL — ABNORMAL HIGH (ref 4.0–10.5)
nRBC: 0 % (ref 0.0–0.2)
nRBC: 0 /100 WBC

## 2021-04-27 LAB — CREATININE, URINE, RANDOM: Creatinine, Urine: 76.4 mg/dL

## 2021-04-27 LAB — BASIC METABOLIC PANEL
Anion gap: 12 (ref 5–15)
BUN: 26 mg/dL — ABNORMAL HIGH (ref 8–23)
CO2: 20 mmol/L — ABNORMAL LOW (ref 22–32)
Calcium: 8.7 mg/dL — ABNORMAL LOW (ref 8.9–10.3)
Chloride: 94 mmol/L — ABNORMAL LOW (ref 98–111)
Creatinine, Ser: 1.39 mg/dL — ABNORMAL HIGH (ref 0.44–1.00)
GFR, Estimated: 39 mL/min — ABNORMAL LOW (ref >60)
Glucose, Bld: 158 mg/dL — ABNORMAL HIGH (ref 70–99)
Potassium: 3.4 mmol/L — ABNORMAL LOW (ref 3.5–5.1)
Sodium: 126 mmol/L — ABNORMAL LOW (ref 135–145)

## 2021-04-27 LAB — TSH: TSH: 1.058 u[IU]/mL (ref 0.350–4.500)

## 2021-04-27 LAB — BRAIN NATRIURETIC PEPTIDE: B Natriuretic Peptide: 188.9 pg/mL — ABNORMAL HIGH (ref 0.0–100.0)

## 2021-04-27 LAB — URIC ACID: Uric Acid, Serum: 9.1 mg/dL — ABNORMAL HIGH (ref 2.5–7.1)

## 2021-04-27 LAB — ECHOCARDIOGRAM COMPLETE
AR max vel: 2.34 cm2
AV Area VTI: 2.36 cm2
AV Area mean vel: 2.23 cm2
AV Mean grad: 4 mmHg
AV Peak grad: 8 mmHg
Ao pk vel: 1.41 m/s
Calc EF: 62.3 %
Height: 60 in
MV VTI: 0.36 cm2
P 1/2 time: 617 msec
S' Lateral: 2.4 cm
Single Plane A2C EF: 62.8 %
Single Plane A4C EF: 64.4 %
Weight: 3827.19 oz

## 2021-04-27 LAB — HEPARIN LEVEL (UNFRACTIONATED)
Heparin Unfractionated: 0.1 IU/mL — ABNORMAL LOW (ref 0.30–0.70)
Heparin Unfractionated: 0.21 IU/mL — ABNORMAL LOW (ref 0.30–0.70)

## 2021-04-27 LAB — PROCALCITONIN: Procalcitonin: 25.23 ng/mL

## 2021-04-27 LAB — SODIUM, URINE, RANDOM: Sodium, Ur: <10 mmol/L

## 2021-04-27 LAB — OSMOLALITY, URINE: Osmolality, Ur: 269 mOsm/kg — ABNORMAL LOW (ref 300–900)

## 2021-04-27 LAB — OSMOLALITY: Osmolality: 270 mOsm/kg — ABNORMAL LOW (ref 275–295)

## 2021-04-27 LAB — MAGNESIUM: Magnesium: 1.9 mg/dL (ref 1.7–2.4)

## 2021-04-27 MED ORDER — SODIUM CHLORIDE 0.9 % IV SOLN: INTRAVENOUS | Status: DC

## 2021-04-27 MED ORDER — LEVALBUTEROL HCL 0.63 MG/3ML IN NEBU
0.6300 mg | INHALATION_SOLUTION | Freq: Three times a day (TID) | RESPIRATORY_TRACT | Status: DC | PRN
  Filled 2021-04-27: qty 3

## 2021-04-27 MED ORDER — PRAVASTATIN SODIUM 40 MG PO TABS
40.0000 mg | ORAL_TABLET | Freq: Every day | ORAL | Status: DC
  Administered 2021-04-27 – 2021-05-04 (×8): 40 mg via ORAL
  Filled 2021-04-27 (×8): qty 1

## 2021-04-27 MED ORDER — SODIUM CHLORIDE 0.9 % IV SOLN
2.0000 g | INTRAVENOUS | Status: DC
  Administered 2021-04-27 – 2021-05-02 (×6): 2 g via INTRAVENOUS
  Filled 2021-04-27 (×6): qty 20

## 2021-04-27 MED ORDER — FUROSEMIDE 10 MG/ML IJ SOLN
20.0000 mg | Freq: Once | INTRAMUSCULAR | Status: AC
  Administered 2021-04-27: 20 mg via INTRAVENOUS
  Filled 2021-04-27: qty 2

## 2021-04-27 MED ORDER — DONEPEZIL HCL 10 MG PO TABS
10.0000 mg | ORAL_TABLET | Freq: Every day | ORAL | Status: DC
  Administered 2021-04-27 – 2021-05-03 (×7): 10 mg via ORAL
  Filled 2021-04-27 (×7): qty 1

## 2021-04-27 MED ORDER — ACETAMINOPHEN 325 MG PO TABS
650.0000 mg | ORAL_TABLET | Freq: Four times a day (QID) | ORAL | Status: DC | PRN
  Administered 2021-04-27 – 2021-05-04 (×5): 650 mg via ORAL
  Filled 2021-04-27 (×5): qty 2

## 2021-04-27 MED ORDER — CYCLOBENZAPRINE HCL 5 MG PO TABS
5.0000 mg | ORAL_TABLET | Freq: Every day | ORAL | Status: DC
  Administered 2021-04-27 – 2021-05-03 (×7): 5 mg via ORAL
  Filled 2021-04-27 (×7): qty 1

## 2021-04-27 MED ORDER — MEMANTINE HCL 10 MG PO TABS
10.0000 mg | ORAL_TABLET | Freq: Two times a day (BID) | ORAL | Status: DC
  Administered 2021-04-27 – 2021-05-04 (×15): 10 mg via ORAL
  Filled 2021-04-27 (×15): qty 1

## 2021-04-27 MED ORDER — FUROSEMIDE 10 MG/ML IJ SOLN
40.0000 mg | Freq: Once | INTRAMUSCULAR | Status: AC
  Administered 2021-04-27: 40 mg via INTRAVENOUS
  Filled 2021-04-27: qty 4

## 2021-04-27 MED ORDER — HEPARIN BOLUS VIA INFUSION
2000.0000 [IU] | Freq: Once | INTRAVENOUS | Status: AC
Start: 2021-04-27 — End: 2021-04-27
  Administered 2021-04-27: 2000 [IU] via INTRAVENOUS
  Filled 2021-04-27: qty 2000

## 2021-04-27 MED ORDER — HEPARIN BOLUS VIA INFUSION
2000.0000 [IU] | Freq: Once | INTRAVENOUS | Status: AC
  Administered 2021-04-27: 2000 [IU] via INTRAVENOUS
  Filled 2021-04-27: qty 2000

## 2021-04-27 MED ORDER — LACTATED RINGERS IV SOLN: INTRAVENOUS | Status: DC

## 2021-04-27 MED ORDER — PANTOPRAZOLE SODIUM 40 MG PO TBEC
40.0000 mg | DELAYED_RELEASE_TABLET | Freq: Every day | ORAL | Status: DC
  Administered 2021-04-27 – 2021-05-04 (×8): 40 mg via ORAL
  Filled 2021-04-27 (×8): qty 1

## 2021-04-27 MED ORDER — LEVALBUTEROL HCL 0.63 MG/3ML IN NEBU
0.6300 mg | INHALATION_SOLUTION | Freq: Once | RESPIRATORY_TRACT | Status: AC
  Administered 2021-04-27: 0.63 mg via RESPIRATORY_TRACT
  Filled 2021-04-27: qty 3

## 2021-04-27 MED ORDER — METHYLPREDNISOLONE SODIUM SUCC 125 MG IJ SOLR
60.0000 mg | Freq: Once | INTRAMUSCULAR | Status: AC
  Administered 2021-04-27: 60 mg via INTRAVENOUS
  Filled 2021-04-27: qty 2

## 2021-04-27 MED ORDER — VENLAFAXINE HCL 75 MG PO TABS
75.0000 mg | ORAL_TABLET | Freq: Three times a day (TID) | ORAL | Status: DC
  Administered 2021-04-27 – 2021-05-04 (×23): 75 mg via ORAL
  Filled 2021-04-27 (×24): qty 1

## 2021-04-27 NOTE — Evaluation (Signed)
Physical Therapy Evaluation Patient Details Name: Christie Sanders MRN: [id number] DOB: 03/09/1946 Today's Date: 04/27/2021  History of Present Illness  76 yo female with onset of a fall at home in which she struck her head was admitted on 2.2 with ongoing UTI and noted acute resp failure with A-fib and RVR due to sepsis.  Pt is weak and on O2, has worsened symptoms of HR and breathing struggles to contend with.  PMHx:  Alz dementia, HA's, fibromyalgia, HTN  Clinical Impression  Pt was seen for progression of movement on side of bed, but with her family gone when PT arrived, could not get a good PLOF on her independence at home.  Given that SNF is approved by family and pt is very dependent to get up and stand currently, PT recommends as well.  Pt was able to move more independently and could therefore benefit from a return to her standing and walking on an AD for family to assist her safely.  Follow along with acute PT goals as are outlined below.     Recommendations for follow up therapy are one component of a multi-disciplinary discharge planning process, led by the attending physician.  Recommendations may be updated based on patient status, additional functional criteria and insurance authorization.  Follow Up Recommendations Skilled nursing-short term rehab (<3 hours/day)    Assistance Recommended at Discharge Frequent or constant Supervision/Assistance  Patient can return home with the following  Two people to help with walking and/or transfers;Two people to help with bathing/dressing/bathroom;Assistance with cooking/housework;Assist for transportation;Help with stairs or ramp for entrance    Equipment Recommendations None recommended by PT  Recommendations for Other Services       Functional Status Assessment Patient has had a recent decline in their functional status and demonstrates the ability to make significant improvements in function in a reasonable and predictable amount of time.      Precautions / Restrictions Precautions Precautions: Fall Precaution Comments: monitor HR and O2 sats Restrictions Weight Bearing Restrictions: No      Mobility  Bed Mobility Overal bed mobility: Needs Assistance Bed Mobility: Supine to Sit, Sit to Supine     Supine to sit: Mod assist Sit to supine: Max assist   General bed mobility comments: mod to support trunk then put in a very low effort back to bed    Transfers Overall transfer level: Needs assistance                 General transfer comment: attempted to get pt to stand and she reported feeling too weak to try    Ambulation/Gait               General Gait Details: unable to try  Stairs            Wheelchair Mobility    Modified Rankin (Stroke Patients Only)       Balance Overall balance assessment: History of Falls, Needs assistance Sitting-balance support: Feet supported Sitting balance-Leahy Scale: Poor Sitting balance - Comments: leans back frequently when attempting to get control of balance in sitting                                     Pertinent Vitals/Pain Pain Assessment Pain Assessment: Faces Faces Pain Scale: Hurts little more Pain Location: general soreness in bed Pain Descriptors / Indicators: Guarding Pain Intervention(s): Monitored during session, Repositioned    Home Living  Family/patient expects to be discharged to:: Skilled nursing facility                   Additional Comments: per chart    Prior Function Prior Level of Function : Needs assist       Physical Assist : Mobility (physical) Mobility (physical): Gait (pt is not clear on her PLOF but did use a walker)           Hand Dominance        Extremity/Trunk Assessment   Upper Extremity Assessment Upper Extremity Assessment: Generalized weakness    Lower Extremity Assessment Lower Extremity Assessment: Generalized weakness    Cervical / Trunk  Assessment Cervical / Trunk Assessment: Kyphotic (generally weak in core)  Communication   Communication: No difficulties  Cognition Arousal/Alertness: Lethargic Behavior During Therapy: Flat affect Overall Cognitive Status: No family/caregiver present to determine baseline cognitive functioning                                 General Comments: unclear if her memory issues are baseline        General Comments General comments (skin integrity, edema, etc.): Pt is unable to assist with sitting on side of bed for more than brief times, and leans back, eventually asking just to return to bed    Exercises     Assessment/Plan    PT Assessment Patient needs continued PT services  PT Problem List Decreased strength;Decreased range of motion;Decreased activity tolerance;Decreased balance;Decreased mobility;Decreased coordination;Decreased knowledge of use of DME;Cardiopulmonary status limiting activity;Decreased skin integrity       PT Treatment Interventions DME instruction;Gait training;Functional mobility training;Therapeutic activities;Therapeutic exercise;Balance training;Neuromuscular re-education;Patient/family education    PT Goals (Current goals can be found in the Care Plan section)  Acute Rehab PT Goals Patient Stated Goal: to get home and feel better PT Goal Formulation: With patient Time For Goal Achievement: 05/11/21 Potential to Achieve Goals: Fair    Frequency Min 2X/week     Co-evaluation               AM-PAC PT "6 Clicks" Mobility  Outcome Measure Help needed turning from your back to your side while in a flat bed without using bedrails?: A Lot Help needed moving from lying on your back to sitting on the side of a flat bed without using bedrails?: A Lot Help needed moving to and from a bed to a chair (including a wheelchair)?: A Lot Help needed standing up from a chair using your arms (e.g., wheelchair or bedside chair)?: Total Help needed  to walk in hospital room?: Total Help needed climbing 3-5 steps with a railing? : Total 6 Click Score: 9    End of Session Equipment Utilized During Treatment: Oxygen Activity Tolerance: Treatment limited secondary to medical complications (Comment);Patient limited by fatigue Patient left: in bed;with call bell/phone within reach;with bed alarm set Nurse Communication: Mobility status PT Visit Diagnosis: Muscle weakness (generalized) (M62.81);Difficulty in walking, not elsewhere classified (R26.2);History of falling (Z91.81)    Time: [id number] PT Time Calculation (min) (ACUTE ONLY): 18 min   Charges:   PT Evaluation $PT Eval Moderate Complexity: 1 Mod         Ramond Dial 04/27/2021, 7:13 PM  Mee Hives, PT PhD Acute Rehab Dept. Number: [facility] [place]

## 2021-04-27 NOTE — Subjective & Objective (Signed)
CC: multiple complaints HPI: 76 year old African-American female with reported history of falling 1 day before admission while going to the kitchen.  Reportedly had hit her head.  Also has had dysuria for about 3 weeks.  Reportedly patient was prescribed antibiotics.  Unclear if the patient was taking them.  Patient seen at the [facility] ER.  She was not febrile on admission but tachycardic.  Blood pressure initially 109/77.  Work-up in the ER included labs:  UA showed leukocyte Estrace 21-50 WBCs many bacteria. White count 18.4, hemoglobin 12.6, platelets 193  Sodium 128, potassium 2.7, chloride 91, bicarb 24, BUN 23, creatinine 1.0  BNP slightly elevated to 221  Magnesium 1.4  Initial lactic acid of 3.4 CK of 542 Repeat lactic acid of 2.1.  COVID-negative, influenza negative.  EKG showed rapid atrial fibrillation.  Patient given 2 L IV fluids.  Started on IV cefepime and vancomycin.  Also given IV Rocephin.  Given 2 g of IV magnesium and 50 mEq of IV potassium(over 5 hours)  [facility] hospitalist contacted for admission.  After the patient was excepted for admission, she became acutely distressed.  Requiring BiPAP.  Patient also noted to be in rapid A. fib and started on Cardizem drip.  IV heparin started as well.

## 2021-04-27 NOTE — Assessment & Plan Note (Addendum)
Admit to progressive bed. Inpatient. Increased lactic acidosis. On IVF and IV abx. Sepsis due to acute cystitis.

## 2021-04-27 NOTE — H&P (Signed)
History and Physical    Christie Sanders [id number] DOB: 1945/06/11 DOA: 04/26/2021  DOS: the patient was seen and examined on 04/26/2021  PCP: Maggie Schwalbe, PA-C   Patient coming from: Home  I have personally briefly reviewed patient's old medical records in [place]  CC: multiple complaints HPI: 76 year old African-American female with reported history of falling 1 day before admission while going to the kitchen.  Reportedly had hit her head.  Also has had dysuria for about 3 weeks.  Reportedly patient was prescribed antibiotics.  Unclear if the patient was taking them.  Patient seen at the [facility] ER.  She was not febrile on admission but tachycardic.  Blood pressure initially 109/77.  Work-up in the ER included labs:  UA showed leukocyte Estrace 21-50 WBCs many bacteria. White count 18.4, hemoglobin 12.6, platelets 193  Sodium 128, potassium 2.7, chloride 91, bicarb 24, BUN 23, creatinine 1.0  BNP slightly elevated to 221  Magnesium 1.4  Initial lactic acid of 3.4 CK of 542 Repeat lactic acid of 2.1.  COVID-negative, influenza negative.  EKG showed rapid atrial fibrillation.  Patient given 2 L IV fluids.  Started on IV cefepime and vancomycin.  Also given IV Rocephin.  Given 2 g of IV magnesium and 50 mEq of IV potassium(over 5 hours)  [facility] hospital[facility] contacted for admission.  After the patient was excepted for admission, she became acutely distressed.  Requiring BiPAP.  Patient also noted to be in rapid A. fib and started on Cardizem drip.  IV heparin started as well.   ED Course: sepsis noted. Started on IVF, abx. In rapid afib. Started on cardizem. Noted to be in respiratory distress/hypoxia. Started on bipap  Review of Systems:  Review of Systems  Unable to perform ROS: Mental acuity   Past Medical History:  Diagnosis Date   Alzheimer disease ([place]) 2005   Chronic headaches    Fibromyalgia    High cholesterol    Hypertension     Seasonal allergies     Past Surgical History:  Procedure Laterality Date   BACK SURGERY     FOOT SURGERY     right   MEDIAL PARTIAL KNEE REPLACEMENT     left   VESICOVAGINAL FISTULA CLOSURE W/ TAH       reports that she has never smoked. She has never used smokeless tobacco. She reports that she does not currently use alcohol. She reports that she does not currently use drugs.  Allergies  Allergen Reactions   Codeine     REACTION: swellling    Family History  Problem Relation Age of Onset   Emphysema Sister        living   Allergies Sister        living   Heart disease Sister        living   Lung cancer Sister        living   Lung cancer Brother        deceased   Aneurysm Brother        deceased    Prior to Admission medications   Medication Sig Start Date End Date Taking? Authorizing Provider  triamterene-hydrochlorothiazide (MAXZIDE-25) 37.5-25 MG tablet Take 2 tablets by mouth daily. 02/07/21  Yes [provider]  ALPRAZolam Duanne Moron) 0.5 MG tablet Take 0.5 mg by mouth 2 (two) times daily as needed for anxiety.    [provider]  aspirin EC 81 MG tablet Take 81 mg by mouth daily.  [provider]  bisoprolol-hydrochlorothiazide (ZIAC) 5-6.25 MG per tablet Take 1 tablet by mouth daily.    [provider]  cyclobenzaprine (FLEXERIL) 10 MG tablet Take 10 mg by mouth 3 (three) times daily.    [provider]  donepezil (ARICEPT) 10 MG tablet Take 10 mg by mouth at bedtime.    [provider]  hydrOXYzine (ATARAX/VISTARIL) 25 MG tablet Take 25 mg by mouth every 6 (six) hours as needed.    [provider]  memantine (NAMENDA) 10 MG tablet Take 10 mg by mouth 2 (two) times daily.    [provider]  omeprazole (PRILOSEC) 20 MG capsule Take 20 mg by mouth. Take 2 capsules daily by mouth    [provider]  pravastatin (PRAVACHOL) 40 MG tablet Take 40 mg by mouth. Take 2 tablets daily     [provider]  sertraline (ZOLOFT) 100 MG tablet Take 100 mg by mouth daily.    [provider]  venlafaxine (EFFEXOR) 75 MG tablet Take 75 mg by mouth 3 (three) times daily with meals.    [provider]    Physical Exam: Vitals:   04/26/21 2140 04/26/21 2306 04/26/21 2311 04/27/21 0000  BP: (!) 117/94 126/82 126/82 114/85  Pulse: (!) 112 98 98 92  Resp: (!) 21 (!) 23 20 (!) 23  Temp:   98.7 F (37.1 C)   TempSrc:   Oral   SpO2: 100%  98% 98%  Weight:      Height:        Physical Exam Vitals and nursing note reviewed.  Constitutional:      General: She is not in acute distress.    Appearance: She is not toxic-appearing or diaphoretic.     Comments: Sleeping, snoring loudly  HENT:     Head: Normocephalic and atraumatic.  Eyes:     Pupils: Pupils are equal, round, and reactive to light.  Cardiovascular:     Rate and Rhythm: Normal rate. Rhythm irregular.  Pulmonary:     Breath sounds: Rhonchi present.     Comments: Snoring loudly, diffuse rhonchi Abdominal:     General: Abdomen is protuberant. Bowel sounds are normal. There is no distension.     Tenderness: There is no abdominal tenderness. There is no guarding.  Musculoskeletal:     Right lower leg: No edema.     Left lower leg: No edema.  Skin:    General: Skin is warm and dry.     Capillary Refill: Capillary refill takes less than 2 seconds.     Labs on Admission: I have personally reviewed following labs and imaging studies  CBC: Recent Labs  Lab 04/26/21 1412  WBC 18.4*  NEUTROABS 15.3*  HGB 12.6  HCT 37.0  MCV 92.7  PLT 0000000   Basic Metabolic Panel: Recent Labs  Lab 04/26/21 1412  NA 128*  K 2.7*  CL 91*  CO2 24  GLUCOSE 130*  BUN 23  CREATININE 1.06*  CALCIUM 9.3  MG 1.4*   GFR: Estimated Creatinine Clearance: 50.4 mL/min (A) (by C-G formula based on SCr of 1.06 mg/dL (H)). Liver Function Tests: No results for input(s): AST, ALT, ALKPHOS, BILITOT, PROT,  ALBUMIN in the last 168 hours. No results for input(s): LIPASE, AMYLASE in the last 168 hours. No results for input(s): AMMONIA in the last 168 hours. Coagulation Profile: No results for input(s): INR, PROTIME in the last 168 hours. Cardiac Enzymes: Recent Labs  Lab 04/26/21 1412  CKTOTAL  542*   BNP (last 3 results) No results for input(s): PROBNP in the last 8760 hours. HbA1C: No results for input(s): HGBA1C in the last 72 hours. CBG: No results for input(s): GLUCAP in the last 168 hours. Lipid Profile: No results for input(s): CHOL, HDL, LDLCALC, TRIG, CHOLHDL, LDLDIRECT in the last 72 hours. Thyroid Function Tests: No results for input(s): TSH, T4TOTAL, FREET4, T3FREE, THYROIDAB in the last 72 hours. Anemia Panel: No results for input(s): VITAMINB12, FOLATE, FERRITIN, TIBC, IRON, RETICCTPCT in the last 72 hours. Urine analysis:    Component Value Date/Time   COLORURINE YELLOW 04/26/2021 1340   APPEARANCEUR HAZY (A) 04/26/2021 1340   LABSPEC 1.014 04/26/2021 1340   PHURINE 6.0 04/26/2021 1340   GLUCOSEU NEGATIVE 04/26/2021 1340   HGBUR MODERATE (A) 04/26/2021 1340   BILIRUBINUR NEGATIVE 04/26/2021 1340   KETONESUR TRACE (A) 04/26/2021 1340   PROTEINUR 100 (A) 04/26/2021 1340   UROBILINOGEN 0.2 01/16/2010 1030   NITRITE NEGATIVE 04/26/2021 1340   LEUKOCYTESUR LARGE (A) 04/26/2021 1340    Radiological Exams on Admission: I have personally reviewed images DG Chest 2 View  Result Date: 04/26/2021 CLINICAL DATA:  Shortness of breath. EXAM: CHEST - 2 VIEW COMPARISON:  Chest CTA dated 11/12/2017. Chest radiographs dated 11/02/2014. FINDINGS: Interval borderline enlarged heart. Tortuous aorta. Interval small amount of pleural fluid or thickening at the left lateral costophrenic angle. Minimal atelectasis or scarring at the right lateral costophrenic angle. Lumbar spine fixation hardware. Thoracic spine degenerative changes. IMPRESSION: 1. Interval borderline cardiomegaly. 2.  Interval small amount of left pleural fluid or thickening. 3. Interval minimal atelectasis or scarring at the right lateral costophrenic angle. Electronically Signed   By: Claudie Revering M.D.   On: 04/26/2021 12:48   CT Head Wo Contrast  Result Date: 04/26/2021 CLINICAL DATA:  Head trauma EXAM: CT HEAD WITHOUT CONTRAST TECHNIQUE: Contiguous axial images were obtained from the base of the skull through the vertex without intravenous contrast. RADIATION DOSE REDUCTION: This exam was performed according to the departmental dose-optimization program which includes automated exposure control, adjustment of the mA and/or kV according to patient size and/or use of iterative reconstruction technique. COMPARISON:  Head CT dated December 06, 2006 FINDINGS: Brain: Punctate right basal ganglia calcification. No evidence of acute infarction, hemorrhage, hydrocephalus, extra-axial collection or mass lesion/mass effect. Vascular: No hyperdense vessel or unexpected calcification. Skull: Normal. Negative for fracture or focal lesion. Sinuses/Orbits: No acute finding. Other: None. IMPRESSION: No acute intracranial abnormality. Electronically Signed   By: Yetta Glassman M.D.   On: 04/26/2021 14:35   DG Chest Portable 1 View  Result Date: 04/26/2021 CLINICAL DATA:  Dyspnea EXAM: PORTABLE CHEST 1 VIEW COMPARISON:  12:30 p.m. FINDINGS: Lungs are well expanded, symmetric, and clear. No pneumothorax or pleural effusion. Cardiac size within normal limits. Pulmonary vascularity is normal. Osseous structures are age-appropriate. No acute bone abnormality. IMPRESSION: No active disease. Electronically Signed   By: Fidela Salisbury M.D.   On: 04/26/2021 20:25    EKG: I have personally reviewed EKG: rapid afib   Assessment/Plan Principal Problem:   Sepsis with acute organ dysfunction without septic shock (HCC) Active Problems:   Acute cystitis without hematuria   Atrial fibrillation with rapid ventricular response (HCC)   Acute  respiratory failure with hypoxia (HCC)   Hyponatremia   Essential hypertension   Alzheimer disease (HCC)    Assessment and Plan: * Sepsis with acute organ dysfunction without septic shock ([place])- (present on admission) Admit to progressive bed. Inpatient. Increased lactic  acidosis. On IVF and IV abx. Sepsis due to acute cystitis.  Acute cystitis without hematuria On IV Rocephin. Awaiting urine cultures.  Hyponatremia Continue with IVF. Stop HCTZ. Check TSH  Acute respiratory failure with hypoxia (HCC) Continue with BIpap. Change to supplemental O2 as needed.  Atrial fibrillation with rapid ventricular response (HCC) No prior hx of afib. On IV cardizem and IV heparin gtts. Given 2 gram of IV mag in [facility]. And 50 meq of IV KCL. Repeat BMP and Mg. Keep Mg >2.0 and K+>4.0.  Check echo. Check TSH.  Alzheimer disease ([place])- (present on admission) Chronic.  Essential hypertension- (present on admission) On IV cardizem gtts. Holding home BP meds for now.   DVT prophylaxis: IV heparin gtts Code Status: Full Code by default. No family present on admission Family Communication: no family at bedside  Disposition Plan: return home  Consults called: none  Admission status: Inpatient,  progressive   Kristopher Oppenheim, DO [facility] 04/27/2021, 12:38 AM

## 2021-04-27 NOTE — Assessment & Plan Note (Signed)
Continue with IVF. Stop HCTZ. Check TSH

## 2021-04-27 NOTE — Assessment & Plan Note (Signed)
On IV cardizem gtts. Holding home BP meds for now.

## 2021-04-27 NOTE — TOC Initial Note (Signed)
Transition of Care [facility]) - Initial/Assessment Note    Patient Details  Name: Christie Sanders MRN: [id number] Date of Birth: 09/27/1945  [facility]) CM/SW Contact:    Benard Halsted, LCSW Phone Number: 04/27/2021, 4:11 PM  Clinical Narrative:                 CSW received consult for possible SNF placement at time of discharge from family. CSW spoke with patient at bedside but family had left. Patient reported that her family is currently unable to care for patient at their home given patients current physical needs and fall risk. Patient expressed understanding of PT recommendation and is agreeable to SNF placement at time of discharge if needed. CSW discussed insurance authorization process and provided Medicare SNF ratings list. CSW will await therapy recommendations. CSW left voicemail for patient's son.    Skilled Nursing Rehab Facilities-   [email or web address]   Ratings out of 5 possible   Name Address  Phone # [place] Inspection Overall  [facility] [address], [place] 5 5 2 4   [facility]  [address], [place] [phone] 4 2 5 5   [facility] [place], [place] 4 1 1 1   [facility] [place], [place] 2 2 4 4   [facility] [address], [place] 2 1 2 1   [place] 3 1 4 3   [facility] [address] [place] 5 2 2 3   [facility] [address] [place] 4 1 2 1   [address] ([place]) [place], [place] [phone] 5 1 2 2   [facility] [phone], [place] [phone] 4 1 1 1   [facility] [address], [facility] [place] [phone] 4 1 2 1   [facility] ([place]) [place] [place], [place] [phone] 4 1 1 1           [place] [address], [place] 4 2 3 3   [facility] [place], [place] 3 1 5 4   [address], [place], [place] [phone] 2 1 1 1   [facility] [address], [employer] [phone] 2 2 3 3           [address] (no [facility]) [place] [address], [place] [phone] 4 5 5 5   [facility] (No Hum[employer]) [address] [place], [place] 4 1 4 3   [facility] (No [employer]) [place], [place] 5 5 5 5   [facility] [address], [place] [phone] 3 3 4 4   [place] [address], [place]  [phone] 2 2 2 2   [facility]ustin Flock [address] [place] [phone] 3 1 3 2   [facility] [place] [address] [place] 1 1 2 1   [place] [address], [place] [id number] 2 1 1 1   [facility] [place], [place] 5 2 4 5   [facility] [address] [place] 2 1 1 1   [facility] [address], [place] 3 1 1 1   [facility] [place] [phone] 2 1 2 1           [facility] [place] [address], [place] [phone] 5 3 3  4  [place] [address] [place] 2 1 1 1   [place] (No Humana) [address], [place] [phone] 2 1 2 1   [facility] [address] [place] [phone] 3 1 1 1           [facility] [place], [place] 4 1 5 4   [facility]) [place], [place] 2 1 3 2   [facility]) [place] [address] [place] 3 1 4 3   [place] [address], [place] 4 1 4 3   [address] [place], [place] 3 3 1 1   [place] [facility]) [address] [place]  [phone] 3 2 3 3      Expected Discharge Plan: [place] Barriers to Discharge: Continued Medical Work up, SNF Pending bed offer, Insurance Authorization   Patient Goals and CMS Choice   CMS Medicare.gov Compare Post Acute Care list provided to:: Patient Choice offered to / list presented to : Patient  Expected Discharge Plan and Services Expected Discharge Plan: [place] In-house Referral: Clinical Social Work                                            Prior Living Arrangements/Services   Lives with:: Self Patient language and need for interpreter reviewed:: Yes Do you feel safe going back to the place where you live?: Yes      Need for Family Participation in Patient Care: Yes (Comment) Care giver support system in place?: No (comment)   Criminal Activity/Legal Involvement Pertinent to Current Situation/Hospitalization: No - Comment as needed  Activities of Daily Living   ADL Screening (condition at time of admission) Patient's cognitive ability adequate to safely complete daily activities?: Yes Is the patient deaf or have difficulty hearing?: No Does the patient have difficulty seeing, even when wearing glasses/contacts?: No Does the patient have difficulty concentrating, remembering, or making decisions?: No Patient able to express need for assistance with ADLs?: Yes Does the patient have difficulty dressing or bathing?: No Independently performs ADLs?: Yes (appropriate for developmental age) Does the patient have difficulty walking or climbing stairs?: Yes Weakness of Legs: Both Weakness of Arms/Hands: Both  Permission Sought/Granted Permission sought to share information with : Facility Sport and exercise psychologist, Family Supports Permission granted to share information with : Yes, Verbal Permission Granted  Share Information with NAME: [name]  Permission granted to share info w AGENCY: SNFs  Permission granted to share  info w Relationship: Son  Permission granted to share info w Contact Information: [phone]  Emotional Assessment Appearance:: Appears stated age Attitude/Demeanor/Rapport: Engaged (sometimes confused) Affect (typically observed): Accepting, Appropriate (sometimes forgetful) Orientation: : Oriented to Self, Oriented to Place, Oriented to  Time, Fluctuating Orientation (Suspected and/or reported Sundowners), Oriented to Situation Alcohol / Substance Use: Not Applicable Psych Involvement: No (comment)  Admission diagnosis:  Hypokalemia [E87.6] Hypomagnesemia [E83.42] NSTEMI (non-ST elevated myocardial infarction) ([place]) [I21.4] Acute cystitis without hematuria [N30.00] Atrial fibrillation with RVR ([place]) [I48.91] Sepsis ([place]) [A41.9] Traumatic rhabdomyolysis, initial encounter ([place]) [T79.6XXA] Altered mental status, unspecified altered mental status type [R41.82] Sepsis, due to unspecified organism, unspecified whether acute organ dysfunction present [facility]) [A41.9] Patient Active Problem List   Diagnosis Date Noted   Acute cystitis without hematuria 04/27/2021   Atrial fibrillation with  rapid ventricular response ([place]) 04/27/2021   Acute respiratory failure with hypoxia (HCC) 04/27/2021   Hyponatremia 04/27/2021   Sepsis with acute organ dysfunction without septic shock ([place]) 04/26/2021   Chronic headaches    Alzheimer disease ([place])    Fibromyalgia    HYPERLIPIDEMIA 08/16/2009   Essential hypertension 08/16/2009   ALLERGIC RHINITIS 08/16/2009   DYSPNEA 08/16/2009   HEADACHE, CHRONIC, HX OF 08/16/2009   PCP:  Maggie Schwalbe, PA-C Pharmacy:   [id number] , [place] [address] [place] [address] Phone: [phone] Fax: [phone]     Social Determinants of Health (SDOH) Interventions    Readmission Risk Interventions No flowsheet data found.

## 2021-04-27 NOTE — Evaluation (Signed)
SLP Cancellation Note  Patient Details Name: Christie Sanders MRN: [id number] DOB: January 11, 1946   Cancelled treatment:       Reason Eval/Treat Not Completed: Other (comment);Patient at procedure or test/unavailable (pt have ECHO done at this time - tech just starting- will continue efforts, RN reports pt took medication with water without difficulty)    Rolena Infante, MS [facility] SLP Acute Rehab Services Office [phone] Cell [phone]   Chales Abrahams 04/27/2021, 10:59 AM

## 2021-04-27 NOTE — Progress Notes (Signed)
PROGRESS NOTE                                                                                                                                                                                                             Patient Demographics:    Christie Sanders, is a 76 y.o. female, DOB - 1945-06-25, KS:[id number]  Outpatient Primary MD for the patient is Nodal, Alphonzo Dublin, PA-C    LOS - 1  Admit date - 04/26/2021    Chief Complaint  Patient presents with   Altered Mental Status       Brief Narrative (HPI from H&P)  - 76 year old African-American female with reported history of falling 1 day before admission while going to the kitchen.  Reportedly had hit her head.  Also has had dysuria for about 3 weeks.  I talked to her son Christie Sanders on 04/27/2021 in detail, according to him she was having dysuria where her urine was burning and had a foul smell for the last few weeks, the last 3 to 4 days prior to hospital admission she was not feeling well and weak, she fell 2 days prior to hospital admission, family was trying to treat her the best at home but since she continued to get weaker they brought her to the ER.  Son also adds that she has been having some generalized headache off and on for the last 4 to 6 weeks, she was also diagnosed with dementia several years ago has been pretty mild thus far.  In the ER she was diagnosed with sepsis due to UTI, new onset A. fib RVR and she was admitted to the hospital.   Subjective:    [name] today has, mild headache, No chest pain, No abdominal pain - No Nausea, No new weakness tingling or numbness, no SOB.   Assessment  & Plan :    Sepsis with AKI, dehydration caused by UTI and most likely gram-negative bacteremia. She is getting IV fluids, sepsis pathophysiology is improving, continue empiric IV Rocephin, till cultures are back vancomycin started in the ER will be continued although I think  she likely does not have a gram-positive infection.  Will check MRSA nasal PCR, follow blood and urine culture results.  Continue hydration with IV fluids today.  2.  AKI with dehydration and hyponatremia.  Hold HCTZ, due to #  1 above, hydrate with IV fluids and monitor.  3.  Newly diagnosed A. fib RVR.  Could be paroxysmal or stress-induced.  Mali vas 2 score is be greater than 3.  Currently on Cardizem and heparin drip continue.  Check TSH and echogram.  4.  Alzheimer's dementia.  Per son has been getting increasingly more confused over the last month or so, supportive care, will remain at risk for delirium in the hospital, minimize narcotics and benzodiazepine.  Head CT is unremarkable no focal deficits for now.  Resume Aricept and Namenda, per son she has not been taking it for a while.  5.  Acute hypoxic respiratory failure in the ER.  Required BiPAP ?, now stable on nasal cannula oxygen,, will monitor closely.  6.  Dyslipidemia.  Resume home dose statin.  7.  GERD.  PPI.         Condition - Extremely Guarded  Family Communication  :  son [name] [phone] 04/27/21 - DNR  Code Status :  DNR  Consults  : None  PUD Prophylaxis :     Procedures  :     CT Head - Non Acute      Disposition Plan  :    Status is: Inpatient  DVT Prophylaxis  :    SCDs Start: 04/27/21 0049 SCDs Start: 04/27/21 0049    Lab Results  Component Value Date   PLT 148 (L) 04/27/2021    Diet :  Diet Order             Diet clear liquid Room service appropriate? Yes; Fluid consistency: Thin  Diet effective now                    Inpatient Medications  Scheduled Meds:  cyclobenzaprine  5 mg Oral QHS   donepezil  10 mg Oral QHS   levalbuterol  0.63 mg Nebulization Once   memantine  10 mg Oral BID   ondansetron (ZOFRAN) IV  4 mg Intravenous Once   pantoprazole  40 mg Oral Daily   pravastatin  40 mg Oral q1800   venlafaxine  75 mg Oral TID WC   Continuous Infusions:  cefTRIAXone  (ROCEPHIN)  IV     diltiazem (CARDIZEM) infusion 10 mg/hr (04/27/21 0200)   heparin 1,200 Units/hr (04/27/21 0529)   lactated ringers     PRN Meds:.levalbuterol  Antibiotics  :    Anti-infectives (From admission, onward)    Start     Dose/Rate Route Frequency Ordered Stop   04/27/21 2000  vancomycin (VANCOREADY) IVPB 1250 mg/250 mL  Status:  Discontinued        1,250 mg 166.7 mL/hr over 90 Minutes Intravenous Every 24 hours 04/26/21 1742 04/27/21 0048   04/27/21 0800  cefTRIAXone (ROCEPHIN) 2 g in sodium chloride 0.9 % 100 mL IVPB        2 g 200 mL/hr over 30 Minutes Intravenous Every 24 hours 04/27/21 0048     04/26/21 1930  vancomycin (VANCOCIN) IVPB 1000 mg/200 mL premix        1,000 mg 200 mL/hr over 60 Minutes Intravenous  Once 04/26/21 1742 04/26/21 2144   04/26/21 1830  vancomycin (VANCOCIN) IVPB 1000 mg/200 mL premix        1,000 mg 200 mL/hr over 60 Minutes Intravenous  Once 04/26/21 1742 04/26/21 1905   04/26/21 1800  ceFEPIme (MAXIPIME) 2 g in sodium chloride 0.9 % 100 mL IVPB  Status:  Discontinued  2 g 200 mL/hr over 30 Minutes Intravenous Every 12 hours 04/26/21 1742 04/27/21 0048   04/26/21 1500  cefTRIAXone (ROCEPHIN) 1 g in sodium chloride 0.9 % 100 mL IVPB        1 g 200 mL/hr over 30 Minutes Intravenous  Once 04/26/21 1458 04/26/21 1947        Time Spent in minutes  30   Lala Lund M.D on 04/27/2021 at 9:03 AM  To page go to [email or web address]   [facility] -  Office  [phone]  See all Orders from today for further details    Objective:   Vitals:   04/26/21 2311 04/27/21 0000 04/27/21 0155 04/27/21 0400  BP: 126/82 114/85 104/83 121/89  Pulse: 98 92 79 98  Resp: 20 (!) 23 20 (!) 24  Temp: 98.7 F (37.1 C)  97.8 F (36.6 C)   TempSrc: Oral  Oral   SpO2: 98% 98% 97% 97%  Weight:      Height:        Wt Readings from Last 3 Encounters:  04/26/21 108.5 kg  09/07/13 108.5 kg     Intake/Output Summary (Last 24 hours) at  04/27/2021 0903 Last data filed at 04/27/2021 0530 Gross per 24 hour  Intake 978.77 ml  Output 300 ml  Net 678.77 ml     Physical Exam  Awake Alert, No new F.N deficits, Normal affect [place].AT,PERRAL Supple Neck, No JVD,   Symmetrical Chest wall movement, Good air movement bilaterally, CTAB RRR,No Gallops,Rubs or new Murmurs,  +ve B.Sounds, Abd Soft, No tenderness,   No Cyanosis, Clubbing or edema       Data Review:    CBC Recent Labs  Lab 04/26/21 1412 04/27/21 0109  WBC 18.4* 18.8*  HGB 12.6 12.5  HCT 37.0 36.0  PLT 193 148*  MCV 92.7 94.0  MCH 31.6 32.6  MCHC 34.1 34.7  RDW 13.2 13.2  LYMPHSABS 1.1 0.4*  MONOABS 1.5* 0.2  EOSABS 0.1 0.0  BASOSABS 0.0 0.0    Electrolytes Recent Labs  Lab 04/26/21 1412 04/26/21 1555 04/26/21 1811 04/27/21 0109  NA 128*  --   --  126*  K 2.7*  --   --  3.4*  CL 91*  --   --  94*  CO2 24  --   --  20*  GLUCOSE 130*  --   --  158*  BUN 23  --   --  26*  CREATININE 1.06*  --   --  1.39*  CALCIUM 9.3  --   --  8.7*  MG 1.4*  --   --  1.9  PROCALCITON  --   --   --  25.23  LATICACIDVEN  --  3.4* 2.1*  --   TSH  --   --   --  1.058  BNP 221.7*  --   --   --     ------------------------------------------------------------------------------------------------------------------ No results for input(s): CHOL, HDL, LDLCALC, TRIG, CHOLHDL, LDLDIRECT in the last 72 hours.  No results found for: HGBA1C  Recent Labs    04/27/21 0109  TSH 1.058   ------------------------------------------------------------------------------------------------------------------ ID Labs Recent Labs  Lab 04/26/21 1412 04/26/21 1555 04/26/21 1811 04/27/21 0109  WBC 18.4*  --   --  18.8*  PLT 193  --   --  148*  PROCALCITON  --   --   --  25.23  LATICACIDVEN  --  3.4* 2.1*  --   CREATININE 1.06*  --   --  1.39*  Cardiac Enzymes No results for input(s): CKMB, TROPONINI, MYOGLOBIN in the last 168 hours.  Invalid input(s): CK   Radiology  Reports DG Chest 2 View  Result Date: 04/26/2021 CLINICAL DATA:  Shortness of breath. EXAM: CHEST - 2 VIEW COMPARISON:  Chest CTA dated 11/12/2017. Chest radiographs dated 11/02/2014. FINDINGS: Interval borderline enlarged heart. Tortuous aorta. Interval small amount of pleural fluid or thickening at the left lateral costophrenic angle. Minimal atelectasis or scarring at the right lateral costophrenic angle. Lumbar spine fixation hardware. Thoracic spine degenerative changes. IMPRESSION: 1. Interval borderline cardiomegaly. 2. Interval small amount of left pleural fluid or thickening. 3. Interval minimal atelectasis or scarring at the right lateral costophrenic angle. Electronically Signed   By: Claudie Revering M.D.   On: 04/26/2021 12:48   CT Head Wo Contrast  Result Date: 04/26/2021 CLINICAL DATA:  Head trauma EXAM: CT HEAD WITHOUT CONTRAST TECHNIQUE: Contiguous axial images were obtained from the base of the skull through the vertex without intravenous contrast. RADIATION DOSE REDUCTION: This exam was performed according to the departmental dose-optimization program which includes automated exposure control, adjustment of the mA and/or kV according to patient size and/or use of iterative reconstruction technique. COMPARISON:  Head CT dated December 06, 2006 FINDINGS: Brain: Punctate right basal ganglia calcification. No evidence of acute infarction, hemorrhage, hydrocephalus, extra-axial collection or mass lesion/mass effect. Vascular: No hyperdense vessel or unexpected calcification. Skull: Normal. Negative for fracture or focal lesion. Sinuses/Orbits: No acute finding. Other: None. IMPRESSION: No acute intracranial abnormality. Electronically Signed   By: Yetta Glassman M.D.   On: 04/26/2021 14:35   DG Chest Port 1 View  Result Date: 04/27/2021 CLINICAL DATA:  76 year old female with shortness of breath. Sepsis. EXAM: PORTABLE CHEST 1 VIEW COMPARISON:  Portable chest 04/26/2021 and earlier. FINDINGS:  Portable AP semi upright view at 0557 hours. Slightly lower lung volumes. Stable cardiac and mediastinal contours, no cardiomegaly. Visualized tracheal air column is within normal limits. Mild blunting of the left lung base suggesting small left pleural effusion. But otherwise Allowing for portable technique the lungs are clear. No acute osseous abnormality identified. Left glenohumeral degeneration. Partially visible lumbar spine hardware. Negative visible bowel gas. IMPRESSION: Small left pleural effusion suspected but no other acute cardiopulmonary abnormality. Electronically Signed   By: Genevie Ann M.D.   On: 04/27/2021 06:43   DG Chest Portable 1 View  Result Date: 04/26/2021 CLINICAL DATA:  Dyspnea EXAM: PORTABLE CHEST 1 VIEW COMPARISON:  12:30 p.m. FINDINGS: Lungs are well expanded, symmetric, and clear. No pneumothorax or pleural effusion. Cardiac size within normal limits. Pulmonary vascularity is normal. Osseous structures are age-appropriate. No acute bone abnormality. IMPRESSION: No active disease. Electronically Signed   By: Fidela Salisbury M.D.   On: 04/26/2021 20:25

## 2021-04-27 NOTE — Assessment & Plan Note (Signed)
On IV Rocephin. Awaiting urine cultures.

## 2021-04-27 NOTE — Evaluation (Signed)
Occupational Therapy Evaluation Patient Details Name: Charlcie CradleMartha E Napoleon MRN: [id number] DOB: 08-02-1945 Today's Date: 04/27/2021   History of Present Illness 76 yo female with onset of a fall at home in which she struck her head was admitted on 2.2 with ongoing UTI and noted acute resp failure with A-fib and RVR due to sepsis.  Pt is weak and on O2, has worsened symptoms of HR and breathing struggles to contend with.  PMHx:  Alz dementia, HA's, fibromyalgia, HTN   Clinical Impression   Pt admitted for concerns listed above. PTA pt reported that she was living alone, using a RW for mobility and independent with all ADL's and IADL's. At this time, pt presents with cognitive deficits, weakness, decreased activity tolerance, and balance deficits. She is requiring mod-max for bed mobility, unable to tolerate sitting more than 30 seconds, and refusing to work on standing, stating that she feels to weak/fatigued today. Recommending SNF level therapies to maximize pt's independence and reduce caregiver burden. OT will follow acutely.       Recommendations for follow up therapy are one component of a multi-disciplinary discharge planning process, led by the attending physician.  Recommendations may be updated based on patient status, additional functional criteria and insurance authorization.   Follow Up Recommendations  Skilled nursing-short term rehab (<3 hours/day)    Assistance Recommended at Discharge Frequent or constant Supervision/Assistance  Patient can return home with the following Two people to help with walking and/or transfers;Two people to help with bathing/dressing/bathroom;Assistance with cooking/housework;Direct supervision/assist for medications management;Direct supervision/assist for financial management;Assist for transportation    Functional Status Assessment  Patient has had a recent decline in their functional status and demonstrates the ability to make significant improvements in  function in a reasonable and predictable amount of time.  Equipment Recommendations  None recommended by OT    Recommendations for Other Services       Precautions / Restrictions Precautions Precautions: Fall Precaution Comments: monitor HR and O2 sats Restrictions Weight Bearing Restrictions: No      Mobility Bed Mobility Overal bed mobility: Needs Assistance Bed Mobility: Supine to Sit, Sit to Supine     Supine to sit: Mod assist Sit to supine: Max assist   General bed mobility comments: mod to support trunk then put in a very low effort back to bed    Transfers Overall transfer level: Needs assistance                 General transfer comment: attempted to get pt to stand and she reported feeling too weak to try      Balance Overall balance assessment: History of Falls, Needs assistance Sitting-balance support: Feet supported Sitting balance-Leahy Scale: Poor Sitting balance - Comments: leans back frequently when attempting to get control of balance in sitting                                   ADL either performed or assessed with clinical judgement   ADL Overall ADL's : Needs assistance/impaired Eating/Feeding: Set up;Bed level   Grooming: Set up;Bed level   Upper Body Bathing: Minimal assistance;Bed level   Lower Body Bathing: Maximal assistance;+2 for physical assistance;+2 for safety/equipment;Bed level   Upper Body Dressing : Minimal assistance;Bed level   Lower Body Dressing: Maximal assistance;+2 for physical assistance;+2 for safety/equipment;Bed level   Toilet Transfer: Maximal assistance;+2 for physical assistance;+2 for safety/equipment;Squat-pivot   Toileting- Clothing Manipulation and  Hygiene: Maximal assistance;Bed level;+2 for safety/equipment;+2 for physical assistance         General ADL Comments: Pt limited to bed level due to poor tolerance sitting EOB. Requriing increased assist due to weakness, decreased  activity tolerance, balance concerns, and cognition     Vision Baseline Vision/History: 1 Wears glasses Ability to See in Adequate Light: 0 Adequate Patient Visual Report: No change from baseline Vision Assessment?: No apparent visual deficits     Perception     Praxis      Pertinent Vitals/Pain Pain Assessment Pain Assessment: Faces Faces Pain Scale: Hurts little more Pain Location: general soreness in bed Pain Descriptors / Indicators: Guarding Pain Intervention(s): Repositioned, Monitored during session     Hand Dominance Right   Extremity/Trunk Assessment Upper Extremity Assessment Upper Extremity Assessment: Generalized weakness   Lower Extremity Assessment Lower Extremity Assessment: Generalized weakness   Cervical / Trunk Assessment Cervical / Trunk Assessment: Kyphotic (generally weak in core)   Communication Communication Communication: No difficulties   Cognition Arousal/Alertness: Lethargic Behavior During Therapy: Flat affect Overall Cognitive Status: History of cognitive impairments - at baseline                                 General Comments: Pt has history of dementia, family reports that it has progressively worsened.     General Comments  VSS on RA    Exercises Exercises:  (LE strength is 2+ hips and 3 knees and ankles)   Shoulder Instructions      Home Living Family/patient expects to be discharged to:: Skilled nursing facility                                 Additional Comments: Pt was living home alone in a 1 level house, using a RW for mobility.      Prior Functioning/Environment Prior Level of Function : Independent/Modified Independent       Physical Assist : Mobility (physical) Mobility (physical): Gait (pt is not clear on her PLOF but did use a walker)   Mobility Comments: Indep with RW, however multiple falls ADLs Comments: Indep per pt        OT Problem List: Decreased strength;Decreased  range of motion;Decreased activity tolerance;Impaired balance (sitting and/or standing);Decreased cognition;Decreased safety awareness;Decreased knowledge of use of DME or AE;Cardiopulmonary status limiting activity;Obesity;Pain      OT Treatment/Interventions: Self-care/ADL training;Therapeutic exercise;Energy conservation;DME and/or AE instruction;Therapeutic activities;Cognitive remediation/compensation;Balance training;Patient/family education    OT Goals(Current goals can be found in the care plan section) Acute Rehab OT Goals Patient Stated Goal: to get stronger OT Goal Formulation: With patient Time For Goal Achievement: 05/11/21 Potential to Achieve Goals: Fair ADL Goals Pt Will Perform Grooming: with set-up;sitting Pt Will Perform Lower Body Bathing: with mod assist;sitting/lateral leans;sit to/from stand Pt Will Perform Lower Body Dressing: with mod assist;sitting/lateral leans;sit to/from stand;with adaptive equipment Pt Will Transfer to Toilet: with mod assist;stand pivot transfer Pt Will Perform Toileting - Clothing Manipulation and hygiene: with mod assist;with adaptive equipment;sitting/lateral leans;sit to/from stand Additional ADL Goal #1: Pt will complete all aspects of bed mobility with min A as a precursor to seated ADL's Additional ADL Goal #2: Pt will tolerate sitting EOB for 5 mins to improve activity tolerance.  OT Frequency: Min 2X/week    Co-evaluation  AM-PAC OT "6 Clicks" Daily Activity     Outcome Measure Help from another person eating meals?: A Little Help from another person taking care of personal grooming?: A Little Help from another person toileting, which includes using toliet, bedpan, or urinal?: Total Help from another person bathing (including washing, rinsing, drying)?: A Lot Help from another person to put on and taking off regular upper body clothing?: A Little Help from another person to put on and taking off regular lower body  clothing?: Total 6 Click Score: 13   End of Session Equipment Utilized During Treatment: Oxygen Nurse Communication: Mobility status  Activity Tolerance: Patient limited by fatigue Patient left: in bed;with call bell/phone within reach;with bed alarm set;with family/visitor present  OT Visit Diagnosis: Unsteadiness on feet (R26.81);Other abnormalities of gait and mobility (R26.89);Muscle weakness (generalized) (M62.81)                Time: 1500-1530 OT Time Calculation (min): 30 min Charges:  OT General Charges $OT Visit: 1 Visit OT Evaluation $OT Eval Moderate Complexity: 1 Mod OT Treatments $Therapeutic Activity: 8-22 mins  Christophe Rising H., OTR/L Acute Rehabilitation  Kameryn Tisdel Elane Bing Plume 04/27/2021, 7:56 PM

## 2021-04-27 NOTE — Progress Notes (Signed)
ANTICOAGULATION CONSULT NOTE Pharmacy Consult for heparin Indication: chest pain/ACS and atrial fibrillation  Allergies  Allergen Reactions   Codeine     REACTION: swellling    Patient Measurements: Height: 5' (152.4 cm) Weight: 108.5 kg (239 lb 3.2 oz) IBW/kg (Calculated) : 45.5 Heparin Dosing Weight: 72kg  Vital Signs: Temp: 97.8 F (36.6 C) (02/03 0155) Temp Source: Oral (02/03 0155) BP: 121/89 (02/03 0400) Pulse Rate: 98 (02/03 0400)  Labs: Recent Labs    04/26/21 1412 04/26/21 1555 04/27/21 0109 04/27/21 0441  HGB 12.6  --  12.5  --   HCT 37.0  --  36.0  --   PLT 193  --  148*  --   HEPARINUNFRC  --   --   --  0.10*  CREATININE 1.06*  --  1.39*  --   CKTOTAL 542*  --   --   --   TROPONINIHS 72* 67*  --   --      Estimated Creatinine Clearance: 38.4 mL/min (A) (by C-G formula based on SCr of 1.39 mg/dL (H)).   Assessment: 76 yo W in atrial fibrillation and NSTEMI. No anticoagulation prior to admission per chart review. Pharmacy consulted for heparin.    H/H , plt wnl. Troponin 72.  Heparin level 0.10  Goal of Therapy:  Heparin level 0.3-0.7 units/ml Monitor platelets by anticoagulation protocol: Yes   Plan:  Heparin 2000 unit bolus then 1200 units/hr F/u 6hr HL Monitor daily heparin level, CBC Monitor for signs/symptoms of bleeding   Thank you Anette Guarneri, PharmD  Please check AMION for all [place] phone numbers After 10:00 PM, call [place]

## 2021-04-27 NOTE — Assessment & Plan Note (Signed)
Chronic. 

## 2021-04-27 NOTE — Assessment & Plan Note (Signed)
No prior hx of afib. On IV cardizem and IV heparin gtts. Given 2 gram of IV mag in [facility]. And 50 meq of IV KCL. Repeat BMP and Mg. Keep Mg >2.0 and K+>4.0.  Check echo. Check TSH.

## 2021-04-27 NOTE — Assessment & Plan Note (Signed)
Continue with BIpap. Change to supplemental O2 as needed.

## 2021-04-28 ENCOUNTER — Inpatient Hospital Stay (HOSPITAL_COMMUNITY): Payer: Medicare HMO

## 2021-04-28 DIAGNOSIS — R4182 Altered mental status, unspecified: Secondary | ICD-10-CM

## 2021-04-28 LAB — CBC WITH DIFFERENTIAL/PLATELET
Abs Immature Granulocytes: 0.04 10*3/uL (ref 0.00–0.07)
Basophils Absolute: 0 10*3/uL (ref 0.0–0.1)
Basophils Relative: 0 %
Eosinophils Absolute: 0.4 10*3/uL (ref 0.0–0.5)
Eosinophils Relative: 3 %
HCT: 36.7 % (ref 36.0–46.0)
Hemoglobin: 12.6 g/dL (ref 12.0–15.0)
Immature Granulocytes: 0 %
Lymphocytes Relative: 4 %
Lymphs Abs: 0.6 10*3/uL — ABNORMAL LOW (ref 0.7–4.0)
MCH: 31.7 pg (ref 26.0–34.0)
MCHC: 34.3 g/dL (ref 30.0–36.0)
MCV: 92.4 fL (ref 80.0–100.0)
Monocytes Absolute: 0.8 10*3/uL (ref 0.1–1.0)
Monocytes Relative: 6 %
Neutro Abs: 11.2 10*3/uL — ABNORMAL HIGH (ref 1.7–7.7)
Neutrophils Relative %: 87 %
Platelets: 126 10*3/uL — ABNORMAL LOW (ref 150–400)
RBC: 3.97 MIL/uL (ref 3.87–5.11)
RDW: 13.2 % (ref 11.5–15.5)
WBC: 13 10*3/uL — ABNORMAL HIGH (ref 4.0–10.5)
nRBC: 0 % (ref 0.0–0.2)

## 2021-04-28 LAB — COMPREHENSIVE METABOLIC PANEL
ALT: 29 U/L (ref 0–44)
AST: 36 U/L (ref 15–41)
Albumin: 2.7 g/dL — ABNORMAL LOW (ref 3.5–5.0)
Alkaline Phosphatase: 86 U/L (ref 38–126)
Anion gap: 13 (ref 5–15)
BUN: 32 mg/dL — ABNORMAL HIGH (ref 8–23)
CO2: 23 mmol/L (ref 22–32)
Calcium: 8.6 mg/dL — ABNORMAL LOW (ref 8.9–10.3)
Chloride: 93 mmol/L — ABNORMAL LOW (ref 98–111)
Creatinine, Ser: 1.43 mg/dL — ABNORMAL HIGH (ref 0.44–1.00)
GFR, Estimated: 38 mL/min — ABNORMAL LOW (ref >60)
Glucose, Bld: 141 mg/dL — ABNORMAL HIGH (ref 70–99)
Potassium: 2.9 mmol/L — ABNORMAL LOW (ref 3.5–5.1)
Sodium: 129 mmol/L — ABNORMAL LOW (ref 135–145)
Total Bilirubin: 0.6 mg/dL (ref 0.3–1.2)
Total Protein: 5.8 g/dL — ABNORMAL LOW (ref 6.5–8.1)

## 2021-04-28 LAB — C-REACTIVE PROTEIN: CRP: 33.8 mg/dL — ABNORMAL HIGH (ref <1.0)

## 2021-04-28 LAB — BRAIN NATRIURETIC PEPTIDE: B Natriuretic Peptide: 686.2 pg/mL — ABNORMAL HIGH (ref 0.0–100.0)

## 2021-04-28 LAB — URINE CULTURE: Culture: >=100000 — AB

## 2021-04-28 LAB — HEPARIN LEVEL (UNFRACTIONATED): Heparin Unfractionated: 0.35 IU/mL (ref 0.30–0.70)

## 2021-04-28 LAB — UREA NITROGEN, URINE: Urea Nitrogen, Ur: 404 mg/dL

## 2021-04-28 LAB — PROCALCITONIN: Procalcitonin: 18.66 ng/mL

## 2021-04-28 LAB — MAGNESIUM: Magnesium: 1.8 mg/dL (ref 1.7–2.4)

## 2021-04-28 MED ORDER — POTASSIUM CHLORIDE CRYS ER 20 MEQ PO TBCR
40.0000 meq | EXTENDED_RELEASE_TABLET | Freq: Two times a day (BID) | ORAL | Status: AC
  Administered 2021-04-28 (×2): 40 meq via ORAL
  Filled 2021-04-28 (×2): qty 2

## 2021-04-28 MED ORDER — MAGNESIUM SULFATE IN D5W 1-5 GM/100ML-% IV SOLN
1.0000 g | Freq: Once | INTRAVENOUS | Status: AC
  Administered 2021-04-28: 1 g via INTRAVENOUS
  Filled 2021-04-28: qty 100

## 2021-04-28 MED ORDER — DILTIAZEM HCL 60 MG PO TABS
90.0000 mg | ORAL_TABLET | Freq: Three times a day (TID) | ORAL | Status: DC
  Administered 2021-04-28 – 2021-05-01 (×9): 90 mg via ORAL
  Filled 2021-04-28 (×9): qty 2

## 2021-04-28 MED ORDER — FUROSEMIDE 40 MG PO TABS
40.0000 mg | ORAL_TABLET | Freq: Once | ORAL | Status: AC
  Administered 2021-04-28: 40 mg via ORAL
  Filled 2021-04-28: qty 1

## 2021-04-28 MED ORDER — APIXABAN 5 MG PO TABS
5.0000 mg | ORAL_TABLET | Freq: Two times a day (BID) | ORAL | Status: DC
  Administered 2021-04-28 – 2021-05-04 (×13): 5 mg via ORAL
  Filled 2021-04-28 (×13): qty 1

## 2021-04-28 MED ORDER — DILTIAZEM HCL 25 MG/5ML IV SOLN: 10.0000 mg | Freq: Four times a day (QID) | INTRAVENOUS | Status: DC | PRN

## 2021-04-28 MED ORDER — METHYLPREDNISOLONE SODIUM SUCC 40 MG IJ SOLR
20.0000 mg | Freq: Once | INTRAMUSCULAR | Status: AC
  Administered 2021-04-28: 20 mg via INTRAVENOUS
  Filled 2021-04-28: qty 1

## 2021-04-28 NOTE — NC FL2 (Addendum)
[place] LEVEL OF CARE SCREENING TOOL     IDENTIFICATION  Patient Name: Christie Sanders Birthdate: 29-Jul-1945 Sex: female Admission Date (Current Location): 04/26/2021  [facility] [place] Number:      Facility and Address:  [facility], [place] [address], [place], [place] [address]      Provider Number: [id number]  Attending Physician Name and Address:  Thurnell Lose, MD  Relative Name and Phone Number:  Coralie Keens [phone]    Current Level of Care: Hospital Recommended Level of Care: [place] Prior Approval Number:    Date Approved/Denied:   PASRR Number:  [id number]  Discharge Plan: SNF    Current Diagnoses: Patient Active Problem List   Diagnosis Date Noted   Acute cystitis without hematuria 04/27/2021   Atrial fibrillation with rapid ventricular response ([place]) 04/27/2021   Acute respiratory failure with hypoxia ([place]) 04/27/2021   Hyponatremia 04/27/2021   Sepsis with acute organ dysfunction without septic shock ([place]) 04/26/2021   Chronic headaches    Alzheimer disease ([place])    Fibromyalgia    HYPERLIPIDEMIA 08/16/2009   Essential hypertension 08/16/2009   ALLERGIC RHINITIS 08/16/2009   DYSPNEA 08/16/2009   HEADACHE, CHRONIC, HX OF 08/16/2009    Orientation RESPIRATION BLADDER Height & Weight     Self, Time, Situation, Place  Normal External catheter Weight: 199 lb 8.3 oz (90.5 kg) Height:  5' (152.4 cm)  BEHAVIORAL SYMPTOMS/MOOD NEUROLOGICAL BOWEL NUTRITION STATUS      Continent Diet (see discharge summary)  AMBULATORY STATUS COMMUNICATION OF NEEDS Skin   Extensive Assist Verbally Normal                       Personal Care Assistance Level of Assistance  Bathing, Dressing Bathing Assistance: Limited assistance   Dressing Assistance: Limited assistance     Functional Limitations Info             [place]  PT (By licensed PT), OT (By licensed OT)      PT Frequency: per facility OT Frequency: per faciilty            Contractures      Additional Factors Info  Code Status Code Status Info: DNR             Current Medications (04/28/2021):  This is the current hospital active medication list Current Facility-Administered Medications  Medication Dose Route Frequency Provider Last Rate Last Admin   acetaminophen (TYLENOL) tablet 650 mg  650 mg Oral Q6H PRN Thurnell Lose, MD   650 mg at 04/27/21 1523   cefTRIAXone (ROCEPHIN) 2 g in sodium chloride 0.9 % 100 mL IVPB  2 g Intravenous Q24H Kristopher Oppenheim, DO 200 mL/hr at 04/28/21 0802 2 g at 04/28/21 0802   cyclobenzaprine (FLEXERIL) tablet 5 mg  5 mg Oral QHS Thurnell Lose, MD   5 mg at 04/27/21 2159   diltiazem (CARDIZEM) 125 mg in dextrose 5% 125 mL (1 mg/mL) infusion  5-15 mg/hr Intravenous Continuous Kristopher Oppenheim, DO 5 mL/hr at 04/27/21 0930 5 mg/hr at 04/27/21 0930   donepezil (ARICEPT) tablet 10 mg  10 mg Oral QHS Thurnell Lose, MD   10 mg at 04/27/21 2159   heparin ADULT infusion 100 units/mL (25000 units/271mL)  1,400 Units/hr Intravenous Continuous Karren Cobble, RPH 14 mL/hr at 04/28/21 0152 1,400 Units/hr at 04/28/21 0152   levalbuterol (XOPENEX) nebulizer solution 0.63 mg  0.63 mg  Nebulization Q8H PRN Thurnell Lose, MD       memantine [facility]) tablet 10 mg  10 mg Oral BID Thurnell Lose, MD   10 mg at 04/28/21 0812   ondansetron (ZOFRAN) injection 4 mg  4 mg Intravenous Once Kristopher Oppenheim, DO       pantoprazole (PROTONIX) EC tablet 40 mg  40 mg Oral Daily Thurnell Lose, MD   40 mg at 04/28/21 [id number]   potassium chloride SA (KLOR-CON M) CR tablet 40 mEq  40 mEq Oral BID Thurnell Lose, MD   40 mEq at 04/28/21 [id number]   pravastatin (PRAVACHOL) tablet 40 mg  40 mg Oral q1800 Thurnell Lose, MD   40 mg at 04/27/21 1729   venlafaxine (EFFEXOR) tablet 75 mg  75 mg Oral TID WC Thurnell Lose, MD   75 mg at 04/28/21 [id number]     Discharge Medications: Please  see discharge summary for a list of discharge medications.  Relevant Imaging Results:  Relevant Lab Results:   Additional Information [id number]  Ina Homes, LCSWA

## 2021-04-28 NOTE — Plan of Care (Signed)

## 2021-04-28 NOTE — Progress Notes (Signed)
[place] for heparin  >> apixaban Indication: chest pain/ACS and atrial fibrillation  Allergies  Allergen Reactions   Codeine Swelling    Patient Measurements: Height: 5' (152.4 cm) Weight: 90.5 kg (199 lb 8.3 oz) IBW/kg (Calculated) : 45.5 Heparin Dosing Weight: 72kg  Vital Signs: Temp: 97.4 F (36.3 C) (02/04 0829) Temp Source: Oral (02/04 0829) BP: 151/95 (02/04 0829) Pulse Rate: 79 (02/04 0829)  Labs: Recent Labs    04/26/21 1412 04/26/21 1555 04/27/21 0109 04/27/21 0441 04/27/21 1344 04/28/21 0036  HGB 12.6  --  12.5  --   --  12.6  HCT 37.0  --  36.0  --   --  36.7  PLT 193  --  148*  --   --  126*  HEPARINUNFRC  --   --   --  0.10* 0.21* 0.35  CREATININE 1.06*  --  1.39*  --   --  1.43*  CKTOTAL 542*  --   --   --   --   --   TROPONINIHS 72* 67*  --   --   --   --      Estimated Creatinine Clearance: 33.6 mL/min (A) (by C-G formula based on SCr of 1.43 mg/dL (H)).   Assessment: 76 yo W in atrial fibrillation and NSTEMI. No anticoagulation prior to admission per chart review. Last heparin level therapeutic. CBC stable. Pharmacy consulted for heparin this admission and now starting eliquis.     Plan:  Start eliquis 5mg  BID Stop IV heparin  Monitor CBC and signs/symptoms of bleeding   Thank you for allowing pharmacy to participate in this patient's care. Pharmacy to sign off but will monitor peripherally.   Levonne Spiller, PharmD PGY1 Acute Care Resident  04/28/2021,11:20 AM

## 2021-04-28 NOTE — Progress Notes (Signed)
PROGRESS NOTE                                                                                                                                                                                                             Patient Demographics:    Christie Sanders, is a 76 y.o. female, DOB - September 25, 1945, [id number]  Outpatient Primary MD for the patient is Nodal, Alphonzo Dublin, PA-C    LOS - 2  Admit date - 04/26/2021    Chief Complaint  Patient presents with   Altered Mental Status       Brief Narrative (HPI from H&P)  - 76 year old African-American female with reported history of falling 1 day before admission while going to the kitchen.  Reportedly had hit her head.  Also has had dysuria for about 3 weeks.  I talked to her son Christie Sanders on 04/27/2021 in detail, according to him she was having dysuria where her urine was burning and had a foul smell for the last few weeks, the last 3 to 4 days prior to hospital admission she was not feeling well and weak, she fell 2 days prior to hospital admission, family was trying to treat her the best at home but since she continued to get weaker they brought her to the ER.  Son also adds that she has been having some generalized headache off and on for the last 4 to 6 weeks, she was also diagnosed with dementia several years ago has been pretty mild thus far.  In the ER she was diagnosed with sepsis due to UTI, new onset A. fib RVR and she was admitted to the hospital.   Subjective:   Patient in bed, appears comfortable, denies any headache, no fever, no chest pain or pressure, no shortness of breath , no abdominal pain. No new focal weakness.   Assessment  & Plan :    Sepsis with AKI, dehydration caused by E. Coli UTI and most likely gram-negative bacteremia. She is getting IV fluids, sepsis pathophysiology is improving, continue empiric IV Rocephin, till cultures are back vancomycin started in the  ER will be continued although I think she likely does not have a gram-positive infection.  Will check MRSA nasal PCR, follow blood and urine culture results.  Continue hydration with IV fluids today.   2.  AKI with Hyponatremia.  Got worse with IV fluids, improved after diuretics, continue gentle diuresis on 04/28/2021 and monitor.  3.  Newly diagnosed A. fib RVR.  Could be paroxysmal or stress-induced.  Mali vas 2 score is be greater than 3.  She was initially on Cardizem and heparin drip now transition to oral Cardizem and Eliquis.  Stable echocardiogram and TSH.  4.  Alzheimer's dementia.  Per son has been getting increasingly more confused over the last month or so, supportive care, will remain at risk for delirium in the hospital, minimize narcotics and benzodiazepine.  Head CT is unremarkable no focal deficits for now.  Resumed Aricept and Namenda, per son she has not been taking it for a while.  5.  Acute hypoxic respiratory failure in the ER.  Required BiPAP ?,  Now stable on room air after diuresis.  Could have been mild acute on chronic diastolic CHF in the ER.  6.  Dyslipidemia.  Resumed home dose statin.  7.  GERD.  PPI.         Condition - Extremely Guarded  Family Communication  :  son [name] [phone] 04/27/21 - DNR  Code Status :  DNR  Consults  : None  PUD Prophylaxis :     Procedures  :     TTE - 1. Left ventricular ejection fraction, by estimation, is 50 to 55%. The left ventricle has low normal function. The left ventricle has no regional wall motion abnormalities. Left ventricular diastolic parameters are indeterminate.  2. Right ventricular systolic function is normal. The right ventricular size is normal. There is normal pulmonary artery systolic pressure.  3. The mitral valve is degenerative. Mild mitral valve regurgitation. No evidence of mitral stenosis. Moderate mitral annular calcification.  4. The aortic valve is tricuspid. There is moderate calcification of  the aortic valve. There is moderate thickening of the aortic valve. Aortic valve regurgitation is mild. Aortic valve sclerosis/calcification is present, without any evidence of aortic stenosis.  5. The inferior vena cava is normal in size with greater than 50% respiratory variability, suggesting right atrial pressure of 3 mmHg.   CT Head - Non Acute      Disposition Plan  :    Status is: Inpatient  DVT Prophylaxis  :    SCDs Start: 04/27/21 0049 SCDs Start: 04/27/21 0049    Lab Results  Component Value Date   PLT 126 (L) 04/28/2021    Diet :  Diet Order             Diet regular Room service appropriate? Yes with Assist; Fluid consistency: Thin  Diet effective now                    Inpatient Medications  Scheduled Meds:  cyclobenzaprine  5 mg Oral QHS   diltiazem  90 mg Oral Q8H   donepezil  10 mg Oral QHS   furosemide  40 mg Oral Once   memantine  10 mg Oral BID   methylPREDNISolone (SOLU-MEDROL) injection  20 mg Intravenous Once   ondansetron (ZOFRAN) IV  4 mg Intravenous Once   pantoprazole  40 mg Oral Daily   potassium chloride  40 mEq Oral BID   pravastatin  40 mg Oral q1800   venlafaxine  75 mg Oral TID WC   Continuous Infusions:  cefTRIAXone (ROCEPHIN)  IV 2 g (04/28/21 0802)   magnesium sulfate bolus IVPB     PRN Meds:.acetaminophen, diltiazem, levalbuterol  Antibiotics  :    Anti-infectives (  From admission, onward)    Start     Dose/Rate Route Frequency Ordered Stop   04/27/21 2000  vancomycin (VANCOREADY) IVPB 1250 mg/250 mL  Status:  Discontinued        1,250 mg 166.7 mL/hr over 90 Minutes Intravenous Every 24 hours 04/26/21 1742 04/27/21 0048   04/27/21 0800  cefTRIAXone (ROCEPHIN) 2 g in sodium chloride 0.9 % 100 mL IVPB        2 g 200 mL/hr over 30 Minutes Intravenous Every 24 hours 04/27/21 0048     04/26/21 1930  vancomycin (VANCOCIN) IVPB 1000 mg/200 mL premix        1,000 mg 200 mL/hr over 60 Minutes Intravenous  Once 04/26/21 1742  04/26/21 2144   04/26/21 1830  vancomycin (VANCOCIN) IVPB 1000 mg/200 mL premix        1,000 mg 200 mL/hr over 60 Minutes Intravenous  Once 04/26/21 1742 04/26/21 1905   04/26/21 1800  ceFEPIme (MAXIPIME) 2 g in sodium chloride 0.9 % 100 mL IVPB  Status:  Discontinued        2 g 200 mL/hr over 30 Minutes Intravenous Every 12 hours 04/26/21 1742 04/27/21 0048   04/26/21 1500  cefTRIAXone (ROCEPHIN) 1 g in sodium chloride 0.9 % 100 mL IVPB        1 g 200 mL/hr over 30 Minutes Intravenous  Once 04/26/21 1458 04/26/21 1947        Time Spent in minutes  30   Lala Lund M.D on 04/28/2021 at 10:51 AM  To page go to [email or web address]   [facility] -  Office  [phone]  See all Orders from today for further details    Objective:   Vitals:   04/28/21 0056 04/28/21 0153 04/28/21 0422 04/28/21 0829  BP: 121/85  117/83 (!) 151/95  Pulse: 77   79  Resp: 15   17  Temp: 98.5 F (36.9 C)  98.3 F (36.8 C) (!) 97.4 F (36.3 C)  TempSrc: Oral  Oral Oral  SpO2:    92%  Weight:  90.5 kg    Height:        Wt Readings from Last 3 Encounters:  04/28/21 90.5 kg  09/07/13 108.5 kg     Intake/Output Summary (Last 24 hours) at 04/28/2021 1051 Last data filed at 04/28/2021 0646 Gross per 24 hour  Intake 343.53 ml  Output 850 ml  Net -506.47 ml     Physical Exam  Awake Alert, No new F.N deficits, Normal affect [place].AT,PERRAL Supple Neck, No JVD,   Symmetrical Chest wall movement, Good air movement bilaterally, few rales RRR,No Gallops, Rubs or new Murmurs,  +ve B.Sounds, Abd Soft, No tenderness,   No Cyanosis, Clubbing or edema        Data Review:    CBC Recent Labs  Lab 04/26/21 1412 04/27/21 0109 04/28/21 0036  WBC 18.4* 18.8* 13.0*  HGB 12.6 12.5 12.6  HCT 37.0 36.0 36.7  PLT 193 148* 126*  MCV 92.7 94.0 92.4  MCH 31.6 32.6 31.7  MCHC 34.1 34.7 34.3  RDW 13.2 13.2 13.2  LYMPHSABS 1.1 0.4* 0.6*  MONOABS 1.5* 0.2 0.8  EOSABS 0.1 0.0 0.4  BASOSABS 0.0 0.0  0.0    Electrolytes Recent Labs  Lab 04/26/21 1412 04/26/21 1555 04/26/21 1811 04/27/21 0109 04/28/21 0036  NA 128*  --   --  126* 129*  K 2.7*  --   --  3.4* 2.9*  CL 91*  --   --  94* 93*  CO2 24  --   --  20* 23  GLUCOSE 130*  --   --  158* 141*  BUN 23  --   --  26* 32*  CREATININE 1.06*  --   --  1.39* 1.43*  CALCIUM 9.3  --   --  8.7* 8.6*  AST  --   --   --   --  36  ALT  --   --   --   --  29  ALKPHOS  --   --   --   --  86  BILITOT  --   --   --   --  0.6  ALBUMIN  --   --   --   --  2.7*  MG 1.4*  --   --  1.9 1.8  CRP  --   --   --   --  33.8*  PROCALCITON  --   --   --  25.23 18.66  LATICACIDVEN  --  3.4* 2.1*  --   --   TSH  --   --   --  1.058  --   BNP 221.7*  --   --  188.9* 686.2*    ------------------------------------------------------------------------------------------------------------------ No results for input(s): CHOL, HDL, LDLCALC, TRIG, CHOLHDL, LDLDIRECT in the last 72 hours.  No results found for: HGBA1C  Recent Labs    04/27/21 0109  TSH 1.058   ------------------------------------------------------------------------------------------------------------------ ID Labs Recent Labs  Lab 04/26/21 1412 04/26/21 1555 04/26/21 1811 04/27/21 0109 04/28/21 0036  WBC 18.4*  --   --  18.8* 13.0*  PLT 193  --   --  148* 126*  CRP  --   --   --   --  33.8*  PROCALCITON  --   --   --  25.23 18.66  LATICACIDVEN  --  3.4* 2.1*  --   --   CREATININE 1.06*  --   --  1.39* 1.43*   Cardiac Enzymes No results for input(s): CKMB, TROPONINI, MYOGLOBIN in the last 168 hours.  Invalid input(s): CK   Radiology Reports DG Chest 2 View  Result Date: 04/26/2021 CLINICAL DATA:  Shortness of breath. EXAM: CHEST - 2 VIEW COMPARISON:  Chest CTA dated 11/12/2017. Chest radiographs dated 11/02/2014. FINDINGS: Interval borderline enlarged heart. Tortuous aorta. Interval small amount of pleural fluid or thickening at the left lateral costophrenic angle.  Minimal atelectasis or scarring at the right lateral costophrenic angle. Lumbar spine fixation hardware. Thoracic spine degenerative changes. IMPRESSION: 1. Interval borderline cardiomegaly. 2. Interval small amount of left pleural fluid or thickening. 3. Interval minimal atelectasis or scarring at the right lateral costophrenic angle. Electronically Signed   By: Claudie Revering M.D.   On: 04/26/2021 12:48   CT Head Wo Contrast  Result Date: 04/26/2021 CLINICAL DATA:  Head trauma EXAM: CT HEAD WITHOUT CONTRAST TECHNIQUE: Contiguous axial images were obtained from the base of the skull through the vertex without intravenous contrast. RADIATION DOSE REDUCTION: This exam was performed according to the departmental dose-optimization program which includes automated exposure control, adjustment of the mA and/or kV according to patient size and/or use of iterative reconstruction technique. COMPARISON:  Head CT dated December 06, 2006 FINDINGS: Brain: Punctate right basal ganglia calcification. No evidence of acute infarction, hemorrhage, hydrocephalus, extra-axial collection or mass lesion/mass effect. Vascular: No hyperdense vessel or unexpected calcification. Skull: Normal. Negative for fracture or focal lesion. Sinuses/Orbits: No acute finding. Other: None. IMPRESSION: No acute intracranial abnormality. Electronically  Signed   By: Yetta Glassman M.D.   On: 04/26/2021 14:35   DG Chest Port 1 View  Result Date: 04/28/2021 CLINICAL DATA:  Shortness of breath. EXAM: PORTABLE CHEST 1 VIEW COMPARISON:  04/27/2020 FINDINGS: 0633 hours. The cardio pericardial silhouette is enlarged. The lungs are clear without focal pneumonia, edema, pneumothorax or pleural effusion. Interstitial markings are diffusely coarsened with chronic features. Bones are diffusely demineralized. Telemetry leads overlie the chest. IMPRESSION: No acute cardiopulmonary findings. Electronically Signed   By: Misty Stanley M.D.   On: 04/28/2021 07:33    DG Chest Port 1 View  Result Date: 04/27/2021 CLINICAL DATA:  76 year old female with shortness of breath. Sepsis. EXAM: PORTABLE CHEST 1 VIEW COMPARISON:  Portable chest 04/26/2021 and earlier. FINDINGS: Portable AP semi upright view at 0557 hours. Slightly lower lung volumes. Stable cardiac and mediastinal contours, no cardiomegaly. Visualized tracheal air column is within normal limits. Mild blunting of the left lung base suggesting small left pleural effusion. But otherwise Allowing for portable technique the lungs are clear. No acute osseous abnormality identified. Left glenohumeral degeneration. Partially visible lumbar spine hardware. Negative visible bowel gas. IMPRESSION: Small left pleural effusion suspected but no other acute cardiopulmonary abnormality. Electronically Signed   By: Genevie Ann M.D.   On: 04/27/2021 06:43   DG Chest Portable 1 View  Result Date: 04/26/2021 CLINICAL DATA:  Dyspnea EXAM: PORTABLE CHEST 1 VIEW COMPARISON:  12:30 p.m. FINDINGS: Lungs are well expanded, symmetric, and clear. No pneumothorax or pleural effusion. Cardiac size within normal limits. Pulmonary vascularity is normal. Osseous structures are age-appropriate. No acute bone abnormality. IMPRESSION: No active disease. Electronically Signed   By: Fidela Salisbury M.D.   On: 04/26/2021 20:25   ECHOCARDIOGRAM COMPLETE  Result Date: 04/27/2021    ECHOCARDIOGRAM REPORT   Patient Name:   ABIEL LINKO Date of Exam: 04/27/2021 Medical Rec #:  [id number]      Height:       60.0 in Accession #:    [id number]     Weight:       239.2 lb Date of Birth:  [birth year]      BSA:          2.014 m Patient Age:    36 years       BP:           121/89 mmHg Patient Gender: F              HR:           99 bpm. Exam Location:  Inpatient Procedure: 2D Echo, Cardiac Doppler and Color Doppler Indications:    I48.91 AFIB  History:        Patient has no prior history of Echocardiogram examinations.                 Risk Factors:Hypertension and  Dyslipidemia.  Sonographer:    Beryle Beams Referring Phys: Hurt  1. Left ventricular ejection fraction, by estimation, is 50 to 55%. The left ventricle has low normal function. The left ventricle has no regional wall motion abnormalities. Left ventricular diastolic parameters are indeterminate.  2. Right ventricular systolic function is normal. The right ventricular size is normal. There is normal pulmonary artery systolic pressure.  3. The mitral valve is degenerative. Mild mitral valve regurgitation. No evidence of mitral stenosis. Moderate mitral annular calcification.  4. The aortic valve is tricuspid. There is moderate calcification of the aortic valve. There is moderate thickening of the  aortic valve. Aortic valve regurgitation is mild. Aortic valve sclerosis/calcification is present, without any evidence of aortic stenosis.  5. The inferior vena cava is normal in size with greater than 50% respiratory variability, suggesting right atrial pressure of 3 mmHg. FINDINGS  Left Ventricle: Left ventricular ejection fraction, by estimation, is 50 to 55%. The left ventricle has low normal function. The left ventricle has no regional wall motion abnormalities. The left ventricular internal cavity size was normal in size. There is no left ventricular hypertrophy. Left ventricular diastolic parameters are indeterminate. Right Ventricle: The right ventricular size is normal. No increase in right ventricular wall thickness. Right ventricular systolic function is normal. There is normal pulmonary artery systolic pressure. The tricuspid regurgitant velocity is 2.74 m/s, and  with an assumed right atrial pressure of 3 mmHg, the estimated right ventricular systolic pressure is 123456 mmHg. Left Atrium: Left atrial size was normal in size. Right Atrium: Right atrial size was normal in size. Pericardium: There is no evidence of pericardial effusion. Mitral Valve: The mitral valve is degenerative in  appearance. There is moderate thickening of the mitral valve leaflet(s). There is moderate calcification of the mitral valve leaflet(s). Moderate mitral annular calcification. Mild mitral valve regurgitation. No evidence of mitral valve stenosis. MV peak gradient, 82.1 mmHg. The mean mitral valve gradient is 56.0 mmHg. Tricuspid Valve: The tricuspid valve is normal in structure. Tricuspid valve regurgitation is mild . No evidence of tricuspid stenosis. Aortic Valve: The aortic valve is tricuspid. There is moderate calcification of the aortic valve. There is moderate thickening of the aortic valve. Aortic valve regurgitation is mild. Aortic regurgitation PHT measures 617 msec. Aortic valve sclerosis/calcification is present, without any evidence of aortic stenosis. Aortic valve mean gradient measures 4.0 mmHg. Aortic valve peak gradient measures 8.0 mmHg. Aortic valve area, by VTI measures 2.36 cm. Pulmonic Valve: The pulmonic valve was normal in structure. Pulmonic valve regurgitation is not visualized. No evidence of pulmonic stenosis. Aorta: The aortic root is normal in size and structure. Venous: The inferior vena cava is normal in size with greater than 50% respiratory variability, suggesting right atrial pressure of 3 mmHg. IAS/Shunts: No atrial level shunt detected by color flow Doppler.  LEFT VENTRICLE PLAX 2D LVIDd:         4.20 cm     Diastology LVIDs:         2.40 cm     LV e' medial:  13.80 cm/s LV PW:         1.10 cm     LV e' lateral: 9.68 cm/s LV IVS:        1.00 cm LVOT diam:     2.10 cm LV SV:         45 LV SV Index:   22 LVOT Area:     3.46 cm  LV Volumes (MOD) LV vol d, MOD A2C: 65.3 ml LV vol d, MOD A4C: 54.7 ml LV vol s, MOD A2C: 24.3 ml LV vol s, MOD A4C: 19.5 ml LV SV MOD A2C:     41.0 ml LV SV MOD A4C:     54.7 ml LV SV MOD BP:      39.7 ml RIGHT VENTRICLE            IVC RV S prime:     9.90 cm/s  IVC diam: 2.10 cm TAPSE (M-mode): 1.9 cm LEFT ATRIUM             Index  RIGHT ATRIUM            Index LA diam:        3.60 cm 1.79 cm/m   RA Area:     15.10 cm LA Vol (A2C):   67.7 ml 33.61 ml/m  RA Volume:   32.50 ml  16.14 ml/m LA Vol (A4C):   43.2 ml 21.45 ml/m LA Biplane Vol: 57.2 ml 28.40 ml/m  AORTIC VALVE                    PULMONIC VALVE AV Area (Vmax):    2.34 cm     PV Vmax:       0.58 m/s AV Area (Vmean):   2.23 cm     PV Vmean:      32.800 cm/s AV Area (VTI):     2.36 cm     PV VTI:        0.079 m AV Vmax:           141.00 cm/s  PV Peak grad:  1.4 mmHg AV Vmean:          95.600 cm/s  PV Mean grad:  1.0 mmHg AV VTI:            0.191 m AV Peak Grad:      8.0 mmHg AV Mean Grad:      4.0 mmHg LVOT Vmax:         95.40 cm/s LVOT Vmean:        61.600 cm/s LVOT VTI:          0.130 m LVOT/AV VTI ratio: 0.68 AI PHT:            617 msec  AORTA Ao Root diam: 3.30 cm MITRAL VALVE              TRICUSPID VALVE MV Area VTI:  0.36 cm    TV Peak grad:   29.4 mmHg MV Peak grad: 82.1 mmHg   TV Mean grad:   21.0 mmHg MV Mean grad: 56.0 mmHg   TV Vmax:        2.71 m/s MV Vmax:      4.53 m/s    TV Vmean:       214.0 cm/s MV Vmean:     352.0 cm/s  TV VTI:         0.74 msec                           TR Peak grad:   30.0 mmHg                           TR Vmax:        274.00 cm/s                            SHUNTS                           Systemic VTI:  0.13 m                           Systemic Diam: 2.10 cm Jenkins Rouge MD Electronically signed by Jenkins Rouge MD Signature Date/Time: 04/27/2021/12:12:04 PM    Final

## 2021-04-28 NOTE — Progress Notes (Signed)
Patient  family called upset that patient wet. NT Elmyra Ricks and myself checked on patient. Patient not wet. Pure wick working well

## 2021-04-28 NOTE — Progress Notes (Signed)
ANTICOAGULATION CONSULT NOTE Pharmacy Consult for heparin Indication: chest pain/ACS and atrial fibrillation  Allergies  Allergen Reactions   Codeine Swelling    Patient Measurements: Height: 5' (152.4 cm) Weight: 90.5 kg (199 lb 8.3 oz) IBW/kg (Calculated) : 45.5 Heparin Dosing Weight: 72kg  Vital Signs: Temp: 98.5 F (36.9 C) (02/04 0056) Temp Source: Oral (02/04 0056) BP: 121/85 (02/04 0056) Pulse Rate: 77 (02/04 0056)  Labs: Recent Labs    04/26/21 1412 04/26/21 1555 04/27/21 0109 04/27/21 0441 04/27/21 1344 04/28/21 0036  HGB 12.6  --  12.5  --   --  12.6  HCT 37.0  --  36.0  --   --  36.7  PLT 193  --  148*  --   --  126*  HEPARINUNFRC  --   --   --  0.10* 0.21* 0.35  CREATININE 1.06*  --  1.39*  --   --  1.43*  CKTOTAL 542*  --   --   --   --   --   TROPONINIHS 72* 67*  --   --   --   --      Estimated Creatinine Clearance: 33.6 mL/min (A) (by C-G formula based on SCr of 1.43 mg/dL (H)).   Assessment: 76 yo W in atrial fibrillation and NSTEMI. No anticoagulation prior to admission per chart review. Pharmacy consulted for heparin.    Heparin level now therapeutic   Goal of Therapy:  Heparin level 0.3-0.7 units/ml Monitor platelets by anticoagulation protocol: Yes   Plan:  Continue heparin at 1400 units / hr Monitor daily heparin level, CBC Monitor for signs/symptoms of bleeding   Thank you Anette Guarneri, PharmD  Please check AMION for all [place] phone numbers After 10:00 PM, call [place]

## 2021-04-28 NOTE — Evaluation (Signed)
Clinical/Bedside Swallow Evaluation Patient Details  Name: GRACILYN GUNIA MRN: [id number] Date of Birth: 1945-05-05  Today's Date: 04/28/2021 Time: SLP Start Time (ACUTE ONLY): 1012 SLP Stop Time (ACUTE ONLY): 1032 SLP Time Calculation (min) (ACUTE ONLY): 20 min  Past Medical History:  Past Medical History:  Diagnosis Date   Alzheimer disease (HCC) 2005   Chronic headaches    Fibromyalgia    High cholesterol    Hypertension    Seasonal allergies    Past Surgical History:  Past Surgical History:  Procedure Laterality Date   BACK SURGERY     FOOT SURGERY     right   MEDIAL PARTIAL KNEE REPLACEMENT     left   VESICOVAGINAL FISTULA CLOSURE W/ TAH     HPI:  76 year old female admitted with sepsis and UTI. Reported history of falling 1 day before admission  Also has had dysuria for about 3 weeks.  CXR 04/27/2021 Small left pleural effusion suspected but no other acute cardiopulmonary abnormality.: Punctate right basal ganglia calcification. No evidence of  acute infarction, hemorrhage, hydrocephalus, extra-axial collection  or mass lesion/mass effect.    Assessment / Plan / Recommendation  Clinical Impression  Pt presents with functional swallowing with no concern for dysphagia or aspiration. She was pleasantly confused but did well following directions.  Oral mechanism exam was normal. She had occasional coughing but it was not related to PO intake.  Oral preparation of solids was WNL; swallow response appeared to be brisk; there were no s/sx of aspiration. Recommend advancing diet from clears to regular solids (received permission from Dr. Thedore Mins). Thin liquids; meds whole in liquid.  No SLP f/u is needed. Our service will sign off. SLP Visit Diagnosis: Dysphagia, unspecified (R13.10)    Aspiration Risk  No limitations    Diet Recommendation   Regular solids, thin liquids  Medication Administration: Whole meds with liquid    Other  Recommendations Oral Care Recommendations: Oral  care BID    Recommendations for follow up therapy are one component of a multi-disciplinary discharge planning process, led by the attending physician.  Recommendations may be updated based on patient status, additional functional criteria and insurance authorization.  Follow up Recommendations No SLP follow up        Swallow Study   General HPI: 76 year old female admitted with sepsis and UTI. Reported history of falling 1 day before admission  Also has had dysuria for about 3 weeks.  CXR 04/27/2021 Small left pleural effusion suspected but no other acute cardiopulmonary abnormality.: Punctate right basal ganglia calcification. No evidence of  acute infarction, hemorrhage, hydrocephalus, extra-axial collection  or mass lesion/mass effect. Type of Study: Bedside Swallow Evaluation Previous Swallow Assessment: no Diet Prior to this Study:  (clear) Temperature Spikes Noted: No Respiratory Status: Nasal cannula History of Recent Intubation: No Behavior/Cognition: Alert;Cooperative;Pleasant mood;Confused Oral Cavity Assessment: Within Functional Limits Oral Care Completed by SLP: Yes Oral Cavity - Dentition: Adequate natural dentition Vision: Functional for self-feeding Self-Feeding Abilities: Needs set up Patient Positioning: Upright in bed Baseline Vocal Quality: Normal Volitional Cough: Strong Volitional Swallow: Able to elicit    Oral/Motor/Sensory Function Overall Oral Motor/Sensory Function: Within functional limits   Ice Chips Ice chips: Within functional limits   Thin Liquid Thin Liquid: Within functional limits    Nectar Thick Nectar Thick Liquid: Not tested   Honey Thick Honey Thick Liquid: Not tested   Puree Puree: Within functional limits   Solid  Blenda Mounts Laurice 04/28/2021,10:40 AM Marchelle Folks L. Samson Frederic, MA CCC/SLP Acute Rehabilitation Services Office number [phone] Pager [phone]

## 2021-04-29 LAB — CBC WITH DIFFERENTIAL/PLATELET
Abs Immature Granulocytes: 0.12 10*3/uL — ABNORMAL HIGH (ref 0.00–0.07)
Basophils Absolute: 0 10*3/uL (ref 0.0–0.1)
Basophils Relative: 0 %
Eosinophils Absolute: 0 10*3/uL (ref 0.0–0.5)
Eosinophils Relative: 0 %
HCT: 35.1 % — ABNORMAL LOW (ref 36.0–46.0)
Hemoglobin: 12.5 g/dL (ref 12.0–15.0)
Immature Granulocytes: 1 %
Lymphocytes Relative: 6 %
Lymphs Abs: 0.9 10*3/uL (ref 0.7–4.0)
MCH: 32.6 pg (ref 26.0–34.0)
MCHC: 35.6 g/dL (ref 30.0–36.0)
MCV: 91.6 fL (ref 80.0–100.0)
Monocytes Absolute: 1.5 10*3/uL — ABNORMAL HIGH (ref 0.1–1.0)
Monocytes Relative: 10 %
Neutro Abs: 12.1 10*3/uL — ABNORMAL HIGH (ref 1.7–7.7)
Neutrophils Relative %: 83 %
Platelets: 172 10*3/uL (ref 150–400)
RBC: 3.83 MIL/uL — ABNORMAL LOW (ref 3.87–5.11)
RDW: 13 % (ref 11.5–15.5)
WBC: 14.6 10*3/uL — ABNORMAL HIGH (ref 4.0–10.5)
nRBC: 0 % (ref 0.0–0.2)

## 2021-04-29 LAB — COMPREHENSIVE METABOLIC PANEL
ALT: 52 U/L — ABNORMAL HIGH (ref 0–44)
AST: 63 U/L — ABNORMAL HIGH (ref 15–41)
Albumin: 2.5 g/dL — ABNORMAL LOW (ref 3.5–5.0)
Alkaline Phosphatase: 80 U/L (ref 38–126)
Anion gap: 10 (ref 5–15)
BUN: 37 mg/dL — ABNORMAL HIGH (ref 8–23)
CO2: 25 mmol/L (ref 22–32)
Calcium: 8.8 mg/dL — ABNORMAL LOW (ref 8.9–10.3)
Chloride: 94 mmol/L — ABNORMAL LOW (ref 98–111)
Creatinine, Ser: 1.22 mg/dL — ABNORMAL HIGH (ref 0.44–1.00)
GFR, Estimated: 46 mL/min — ABNORMAL LOW (ref >60)
Glucose, Bld: 116 mg/dL — ABNORMAL HIGH (ref 70–99)
Potassium: 4.3 mmol/L (ref 3.5–5.1)
Sodium: 129 mmol/L — ABNORMAL LOW (ref 135–145)
Total Bilirubin: 0.9 mg/dL (ref 0.3–1.2)
Total Protein: 5.5 g/dL — ABNORMAL LOW (ref 6.5–8.1)

## 2021-04-29 LAB — BRAIN NATRIURETIC PEPTIDE: B Natriuretic Peptide: 141 pg/mL — ABNORMAL HIGH (ref 0.0–100.0)

## 2021-04-29 LAB — MAGNESIUM: Magnesium: 1.9 mg/dL (ref 1.7–2.4)

## 2021-04-29 LAB — C-REACTIVE PROTEIN: CRP: 19 mg/dL — ABNORMAL HIGH (ref <1.0)

## 2021-04-29 LAB — PROCALCITONIN: Procalcitonin: 7.5 ng/mL

## 2021-04-29 MED ORDER — FUROSEMIDE 40 MG PO TABS
40.0000 mg | ORAL_TABLET | Freq: Once | ORAL | Status: AC
  Administered 2021-04-29: 40 mg via ORAL
  Filled 2021-04-29: qty 1

## 2021-04-29 NOTE — Progress Notes (Signed)
PROGRESS NOTE                                                                                                                                                                                                             Patient Demographics:    Christie Sanders, is a 76 y.o. female, DOB - 04-Sep-1945, [id number]  Outpatient Primary MD for the patient is Nodal, Alphonzo Dublin, PA-C    LOS - 3  Admit date - 04/26/2021    Chief Complaint  Patient presents with   Altered Mental Status       Brief Narrative (HPI from H&P)  - 76 year old African-American female with reported history of falling 1 day before admission while going to the kitchen.  Reportedly had hit her head.  Also has had dysuria for about 3 weeks.  I talked to her son [name] on 04/27/2021 in detail, according to him she was having dysuria where her urine was burning and had a foul smell for the last few weeks, the last 3 to 4 days prior to hospital admission she was not feeling well and weak, she fell 2 days prior to hospital admission, family was trying to treat her the best at home but since she continued to get weaker they brought her to the ER.  Son also adds that she has been having some generalized headache off and on for the last 4 to 6 weeks, she was also diagnosed with dementia several years ago has been pretty mild thus far.  In the ER she was diagnosed with sepsis due to UTI, new onset A. fib RVR and she was admitted to the hospital.   Subjective:   Patient in bed, appears comfortable, denies any headache, no fever, no chest pain or pressure, no shortness of breath , no abdominal pain. No new focal weakness.    Assessment  & Plan :    Sepsis with AKI, dehydration caused by E. Coli UTI and most likely gram-negative bacteremia. She is getting IV fluids, sepsis pathophysiology has resolved, she has been adequately hydrated, E. coli is pansensitive continue IV  Rocephin.  Continue PT OT monitor may require SNF.  2.  AKI with Hyponatremia.  Got worse with IV fluids, improved after diuretics, continue gentle diuresis on 04/28/2021 and monitor.  3.  Newly diagnosed A. fib RVR.  Could be  paroxysmal or stress-induced.  Mali vas 2 score is be greater than 3.  She was initially on Cardizem and heparin drip now transition to oral Cardizem and Eliquis.  Stable echocardiogram and TSH.  4.  Alzheimer's dementia.  Per son has been getting increasingly more confused over the last month or so, supportive care, will remain at risk for delirium in the hospital, minimize narcotics and benzodiazepine.  Head CT is unremarkable no focal deficits for now.  Resumed Aricept and Namenda, per son she has not been taking it for a while.  5.  Acute hypoxic respiratory failure in the ER.  Required BiPAP ?,  Now stable on room air after diuresis.  Could have been mild acute on chronic diastolic CHF in the ER.  6.  Dyslipidemia.  Resumed home dose statin.  7.  GERD.  PPI.         Condition - Extremely Guarded  Family Communication  :  son [name] [phone] 04/27/21 - DNR  Code Status :  DNR  Consults  : None  PUD Prophylaxis :     Procedures  :     TTE - 1. Left ventricular ejection fraction, by estimation, is 50 to 55%. The left ventricle has low normal function. The left ventricle has no regional wall motion abnormalities. Left ventricular diastolic parameters are indeterminate.  2. Right ventricular systolic function is normal. The right ventricular size is normal. There is normal pulmonary artery systolic pressure.  3. The mitral valve is degenerative. Mild mitral valve regurgitation. No evidence of mitral stenosis. Moderate mitral annular calcification.  4. The aortic valve is tricuspid. There is moderate calcification of the aortic valve. There is moderate thickening of the aortic valve. Aortic valve regurgitation is mild. Aortic valve sclerosis/calcification is  present, without any evidence of aortic stenosis.  5. The inferior vena cava is normal in size with greater than 50% respiratory variability, suggesting right atrial pressure of 3 mmHg.   CT Head - Non Acute      Disposition Plan  :    Status is: Inpatient  DVT Prophylaxis  :    SCDs Start: 04/27/21 0049 SCDs Start: 04/27/21 0049 apixaban (ELIQUIS) tablet 5 mg    Lab Results  Component Value Date   PLT 172 04/29/2021    Diet :  Diet Order             Diet regular Room service appropriate? Yes with Assist; Fluid consistency: Thin; Fluid restriction: 1200 mL Fluid  Diet effective now                    Inpatient Medications  Scheduled Meds:  apixaban  5 mg Oral BID   cyclobenzaprine  5 mg Oral QHS   diltiazem  90 mg Oral Q8H   donepezil  10 mg Oral QHS   memantine  10 mg Oral BID   ondansetron (ZOFRAN) IV  4 mg Intravenous Once   pantoprazole  40 mg Oral Daily   pravastatin  40 mg Oral q1800   venlafaxine  75 mg Oral TID WC   Continuous Infusions:  cefTRIAXone (ROCEPHIN)  IV 2 g (04/29/21 0836)   PRN Meds:.acetaminophen, diltiazem, levalbuterol  Antibiotics  :    Anti-infectives (From admission, onward)    Start     Dose/Rate Route Frequency Ordered Stop   04/27/21 2000  vancomycin (VANCOREADY) IVPB 1250 mg/250 mL  Status:  Discontinued        1,250 mg 166.7 mL/hr over 90  Minutes Intravenous Every 24 hours 04/26/21 1742 04/27/21 0048   04/27/21 0800  cefTRIAXone (ROCEPHIN) 2 g in sodium chloride 0.9 % 100 mL IVPB        2 g 200 mL/hr over 30 Minutes Intravenous Every 24 hours 04/27/21 0048     04/26/21 1930  vancomycin (VANCOCIN) IVPB 1000 mg/200 mL premix        1,000 mg 200 mL/hr over 60 Minutes Intravenous  Once 04/26/21 1742 04/26/21 2144   04/26/21 1830  vancomycin (VANCOCIN) IVPB 1000 mg/200 mL premix        1,000 mg 200 mL/hr over 60 Minutes Intravenous  Once 04/26/21 1742 04/26/21 1905   04/26/21 1800  ceFEPIme (MAXIPIME) 2 g in sodium  chloride 0.9 % 100 mL IVPB  Status:  Discontinued        2 g 200 mL/hr over 30 Minutes Intravenous Every 12 hours 04/26/21 1742 04/27/21 0048   04/26/21 1500  cefTRIAXone (ROCEPHIN) 1 g in sodium chloride 0.9 % 100 mL IVPB        1 g 200 mL/hr over 30 Minutes Intravenous  Once 04/26/21 1458 04/26/21 1947        Time Spent in minutes  30   Lala Lund M.D on 04/29/2021 at 10:21 AM  To page go to [email or web address]   [facility] -  Office  [phone]  See all Orders from today for further details    Objective:   Vitals:   04/28/21 1953 04/28/21 2330 04/29/21 0400 04/29/21 0753  BP: (!) 138/91 129/84 135/89 127/80  Pulse:  93 78 78  Resp: 19 19 16 18   Temp:  97.8 F (36.6 C) 98.1 F (36.7 C) 98.5 F (36.9 C)  TempSrc:  Oral Oral Oral  SpO2: 96% 94% 98%   Weight:   88.3 kg   Height:        Wt Readings from Last 3 Encounters:  04/29/21 88.3 kg  09/07/13 108.5 kg     Intake/Output Summary (Last 24 hours) at 04/29/2021 1021 Last data filed at 04/29/2021 0930 Gross per 24 hour  Intake 371.64 ml  Output 3600 ml  Net -3228.36 ml     Physical Exam  Awake Alert x 2, No new F.N deficits, Normal affect [place].AT,PERRAL Supple Neck, No JVD,   Symmetrical Chest wall movement, Good air movement bilaterally, CTAB RRR,No Gallops, Rubs or new Murmurs,  +ve B.Sounds, Abd Soft, No tenderness,   No Cyanosis, Clubbing or edema      Data Review:    CBC Recent Labs  Lab 04/26/21 1412 04/27/21 0109 04/28/21 0036 04/29/21 0149  WBC 18.4* 18.8* 13.0* 14.6*  HGB 12.6 12.5 12.6 12.5  HCT 37.0 36.0 36.7 35.1*  PLT 193 148* 126* 172  MCV 92.7 94.0 92.4 91.6  MCH 31.6 32.6 31.7 32.6  MCHC 34.1 34.7 34.3 35.6  RDW 13.2 13.2 13.2 13.0  LYMPHSABS 1.1 0.4* 0.6* 0.9  MONOABS 1.5* 0.2 0.8 1.5*  EOSABS 0.1 0.0 0.4 0.0  BASOSABS 0.0 0.0 0.0 0.0    Electrolytes Recent Labs  Lab 04/26/21 1412 04/26/21 1555 04/26/21 1811 04/27/21 0109 04/28/21 0036 04/29/21 0149  NA  128*  --   --  126* 129* 129*  K 2.7*  --   --  3.4* 2.9* 4.3  CL 91*  --   --  94* 93* 94*  CO2 24  --   --  20* 23 25  GLUCOSE 130*  --   --  158* 141* 116*  BUN 23  --   --  26* 32* 37*  CREATININE 1.06*  --   --  1.39* 1.43* 1.22*  CALCIUM 9.3  --   --  8.7* 8.6* 8.8*  AST  --   --   --   --  36 63*  ALT  --   --   --   --  29 52*  ALKPHOS  --   --   --   --  86 80  BILITOT  --   --   --   --  0.6 0.9  ALBUMIN  --   --   --   --  2.7* 2.5*  MG 1.4*  --   --  1.9 1.8 1.9  CRP  --   --   --   --  33.8* 19.0*  PROCALCITON  --   --   --  25.23 18.66 7.50  LATICACIDVEN  --  3.4* 2.1*  --   --   --   TSH  --   --   --  1.058  --   --   BNP 221.7*  --   --  188.9* 686.2* 141.0*    ------------------------------------------------------------------------------------------------------------------ No results for input(s): CHOL, HDL, LDLCALC, TRIG, CHOLHDL, LDLDIRECT in the last 72 hours.  No results found for: HGBA1C  Recent Labs    04/27/21 0109  TSH 1.058   ------------------------------------------------------------------------------------------------------------------ ID Labs Recent Labs  Lab 04/26/21 1412 04/26/21 1555 04/26/21 1811 04/27/21 0109 04/28/21 0036 04/29/21 0149  WBC 18.4*  --   --  18.8* 13.0* 14.6*  PLT 193  --   --  148* 126* 172  CRP  --   --   --   --  33.8* 19.0*  PROCALCITON  --   --   --  25.23 18.66 7.50  LATICACIDVEN  --  3.4* 2.1*  --   --   --   CREATININE 1.06*  --   --  1.39* 1.43* 1.22*   Cardiac Enzymes No results for input(s): CKMB, TROPONINI, MYOGLOBIN in the last 168 hours.  Invalid input(s): CK   Radiology Reports DG Chest 2 View  Result Date: 04/26/2021 CLINICAL DATA:  Shortness of breath. EXAM: CHEST - 2 VIEW COMPARISON:  Chest CTA dated 11/12/2017. Chest radiographs dated 11/02/2014. FINDINGS: Interval borderline enlarged heart. Tortuous aorta. Interval small amount of pleural fluid or thickening at the left lateral costophrenic  angle. Minimal atelectasis or scarring at the right lateral costophrenic angle. Lumbar spine fixation hardware. Thoracic spine degenerative changes. IMPRESSION: 1. Interval borderline cardiomegaly. 2. Interval small amount of left pleural fluid or thickening. 3. Interval minimal atelectasis or scarring at the right lateral costophrenic angle. Electronically Signed   By: Claudie Revering M.D.   On: 04/26/2021 12:48   CT Head Wo Contrast  Result Date: 04/26/2021 CLINICAL DATA:  Head trauma EXAM: CT HEAD WITHOUT CONTRAST TECHNIQUE: Contiguous axial images were obtained from the base of the skull through the vertex without intravenous contrast. RADIATION DOSE REDUCTION: This exam was performed according to the departmental dose-optimization program which includes automated exposure control, adjustment of the mA and/or kV according to patient size and/or use of iterative reconstruction technique. COMPARISON:  Head CT dated December 06, 2006 FINDINGS: Brain: Punctate right basal ganglia calcification. No evidence of acute infarction, hemorrhage, hydrocephalus, extra-axial collection or mass lesion/mass effect. Vascular: No hyperdense vessel or unexpected calcification. Skull: Normal. Negative for fracture or focal lesion. Sinuses/Orbits: No acute finding. Other: None. IMPRESSION: No acute  intracranial abnormality. Electronically Signed   By: Yetta Glassman M.D.   On: 04/26/2021 14:35   DG Chest Port 1 View  Result Date: 04/28/2021 CLINICAL DATA:  Shortness of breath. EXAM: PORTABLE CHEST 1 VIEW COMPARISON:  04/27/2020 FINDINGS: 0633 hours. The cardio pericardial silhouette is enlarged. The lungs are clear without focal pneumonia, edema, pneumothorax or pleural effusion. Interstitial markings are diffusely coarsened with chronic features. Bones are diffusely demineralized. Telemetry leads overlie the chest. IMPRESSION: No acute cardiopulmonary findings. Electronically Signed   By: Misty Stanley M.D.   On: 04/28/2021  07:33   DG Chest Port 1 View  Result Date: 04/27/2021 CLINICAL DATA:  76 year old female with shortness of breath. Sepsis. EXAM: PORTABLE CHEST 1 VIEW COMPARISON:  Portable chest 04/26/2021 and earlier. FINDINGS: Portable AP semi upright view at 0557 hours. Slightly lower lung volumes. Stable cardiac and mediastinal contours, no cardiomegaly. Visualized tracheal air column is within normal limits. Mild blunting of the left lung base suggesting small left pleural effusion. But otherwise Allowing for portable technique the lungs are clear. No acute osseous abnormality identified. Left glenohumeral degeneration. Partially visible lumbar spine hardware. Negative visible bowel gas. IMPRESSION: Small left pleural effusion suspected but no other acute cardiopulmonary abnormality. Electronically Signed   By: Genevie Ann M.D.   On: 04/27/2021 06:43   DG Chest Portable 1 View  Result Date: 04/26/2021 CLINICAL DATA:  Dyspnea EXAM: PORTABLE CHEST 1 VIEW COMPARISON:  12:30 p.m. FINDINGS: Lungs are well expanded, symmetric, and clear. No pneumothorax or pleural effusion. Cardiac size within normal limits. Pulmonary vascularity is normal. Osseous structures are age-appropriate. No acute bone abnormality. IMPRESSION: No active disease. Electronically Signed   By: Fidela Salisbury M.D.   On: 04/26/2021 20:25   ECHOCARDIOGRAM COMPLETE  Result Date: 04/27/2021    ECHOCARDIOGRAM REPORT   Patient Name:   KYRENE HILLENBRAND Date of Exam: 04/27/2021 Medical Rec #:  [id number]      Height:       60.0 in Accession #:    [id number]     Weight:       239.2 lb Date of Birth:  Aug 30, 1945      BSA:          2.014 m Patient Age:    54 years       BP:           121/89 mmHg Patient Gender: F              HR:           99 bpm. Exam Location:  Inpatient Procedure: 2D Echo, Cardiac Doppler and Color Doppler Indications:    I48.91 AFIB  History:        Patient has no prior history of Echocardiogram examinations.                 Risk Factors:Hypertension  and Dyslipidemia.  Sonographer:    Beryle Beams Referring Phys: White  1. Left ventricular ejection fraction, by estimation, is 50 to 55%. The left ventricle has low normal function. The left ventricle has no regional wall motion abnormalities. Left ventricular diastolic parameters are indeterminate.  2. Right ventricular systolic function is normal. The right ventricular size is normal. There is normal pulmonary artery systolic pressure.  3. The mitral valve is degenerative. Mild mitral valve regurgitation. No evidence of mitral stenosis. Moderate mitral annular calcification.  4. The aortic valve is tricuspid. There is moderate calcification of the aortic valve. There is moderate  thickening of the aortic valve. Aortic valve regurgitation is mild. Aortic valve sclerosis/calcification is present, without any evidence of aortic stenosis.  5. The inferior vena cava is normal in size with greater than 50% respiratory variability, suggesting right atrial pressure of 3 mmHg. FINDINGS  Left Ventricle: Left ventricular ejection fraction, by estimation, is 50 to 55%. The left ventricle has low normal function. The left ventricle has no regional wall motion abnormalities. The left ventricular internal cavity size was normal in size. There is no left ventricular hypertrophy. Left ventricular diastolic parameters are indeterminate. Right Ventricle: The right ventricular size is normal. No increase in right ventricular wall thickness. Right ventricular systolic function is normal. There is normal pulmonary artery systolic pressure. The tricuspid regurgitant velocity is 2.74 m/s, and  with an assumed right atrial pressure of 3 mmHg, the estimated right ventricular systolic pressure is 33.0 mmHg. Left Atrium: Left atrial size was normal in size. Right Atrium: Right atrial size was normal in size. Pericardium: There is no evidence of pericardial effusion. Mitral Valve: The mitral valve is degenerative in  appearance. There is moderate thickening of the mitral valve leaflet(s). There is moderate calcification of the mitral valve leaflet(s). Moderate mitral annular calcification. Mild mitral valve regurgitation. No evidence of mitral valve stenosis. MV peak gradient, 82.1 mmHg. The mean mitral valve gradient is 56.0 mmHg. Tricuspid Valve: The tricuspid valve is normal in structure. Tricuspid valve regurgitation is mild . No evidence of tricuspid stenosis. Aortic Valve: The aortic valve is tricuspid. There is moderate calcification of the aortic valve. There is moderate thickening of the aortic valve. Aortic valve regurgitation is mild. Aortic regurgitation PHT measures 617 msec. Aortic valve sclerosis/calcification is present, without any evidence of aortic stenosis. Aortic valve mean gradient measures 4.0 mmHg. Aortic valve peak gradient measures 8.0 mmHg. Aortic valve area, by VTI measures 2.36 cm. Pulmonic Valve: The pulmonic valve was normal in structure. Pulmonic valve regurgitation is not visualized. No evidence of pulmonic stenosis. Aorta: The aortic root is normal in size and structure. Venous: The inferior vena cava is normal in size with greater than 50% respiratory variability, suggesting right atrial pressure of 3 mmHg. IAS/Shunts: No atrial level shunt detected by color flow Doppler.  LEFT VENTRICLE PLAX 2D LVIDd:         4.20 cm     Diastology LVIDs:         2.40 cm     LV e' medial:  13.80 cm/s LV PW:         1.10 cm     LV e' lateral: 9.68 cm/s LV IVS:        1.00 cm LVOT diam:     2.10 cm LV SV:         45 LV SV Index:   22 LVOT Area:     3.46 cm  LV Volumes (MOD) LV vol d, MOD A2C: 65.3 ml LV vol d, MOD A4C: 54.7 ml LV vol s, MOD A2C: 24.3 ml LV vol s, MOD A4C: 19.5 ml LV SV MOD A2C:     41.0 ml LV SV MOD A4C:     54.7 ml LV SV MOD BP:      39.7 ml RIGHT VENTRICLE            IVC RV S prime:     9.90 cm/s  IVC diam: 2.10 cm TAPSE (M-mode): 1.9 cm LEFT ATRIUM             Index  RIGHT ATRIUM            Index LA diam:        3.60 cm 1.79 cm/m   RA Area:     15.10 cm LA Vol (A2C):   67.7 ml 33.61 ml/m  RA Volume:   32.50 ml  16.14 ml/m LA Vol (A4C):   43.2 ml 21.45 ml/m LA Biplane Vol: 57.2 ml 28.40 ml/m  AORTIC VALVE                    PULMONIC VALVE AV Area (Vmax):    2.34 cm     PV Vmax:       0.58 m/s AV Area (Vmean):   2.23 cm     PV Vmean:      32.800 cm/s AV Area (VTI):     2.36 cm     PV VTI:        0.079 m AV Vmax:           141.00 cm/s  PV Peak grad:  1.4 mmHg AV Vmean:          95.600 cm/s  PV Mean grad:  1.0 mmHg AV VTI:            0.191 m AV Peak Grad:      8.0 mmHg AV Mean Grad:      4.0 mmHg LVOT Vmax:         95.40 cm/s LVOT Vmean:        61.600 cm/s LVOT VTI:          0.130 m LVOT/AV VTI ratio: 0.68 AI PHT:            617 msec  AORTA Ao Root diam: 3.30 cm MITRAL VALVE              TRICUSPID VALVE MV Area VTI:  0.36 cm    TV Peak grad:   29.4 mmHg MV Peak grad: 82.1 mmHg   TV Mean grad:   21.0 mmHg MV Mean grad: 56.0 mmHg   TV Vmax:        2.71 m/s MV Vmax:      4.53 m/s    TV Vmean:       214.0 cm/s MV Vmean:     352.0 cm/s  TV VTI:         0.74 msec                           TR Peak grad:   30.0 mmHg                           TR Vmax:        274.00 cm/s                            SHUNTS                           Systemic VTI:  0.13 m                           Systemic Diam: 2.10 cm Jenkins Rouge MD Electronically signed by Jenkins Rouge MD Signature Date/Time: 04/27/2021/12:12:04 PM    Final

## 2021-04-30 ENCOUNTER — Other Ambulatory Visit (HOSPITAL_COMMUNITY): Payer: Self-pay

## 2021-04-30 LAB — BLOOD CULTURE ID PANEL (REFLEXED) - BCID2

## 2021-04-30 LAB — CBC WITH DIFFERENTIAL/PLATELET
Abs Immature Granulocytes: 0.17 10*3/uL — ABNORMAL HIGH (ref 0.00–0.07)
Basophils Absolute: 0 10*3/uL (ref 0.0–0.1)
Basophils Relative: 0 %
Eosinophils Absolute: 0 10*3/uL (ref 0.0–0.5)
Eosinophils Relative: 0 %
HCT: 37.4 % (ref 36.0–46.0)
Hemoglobin: 13.2 g/dL (ref 12.0–15.0)
Immature Granulocytes: 1 %
Lymphocytes Relative: 11 %
Lymphs Abs: 1.7 10*3/uL (ref 0.7–4.0)
MCH: 32.6 pg (ref 26.0–34.0)
MCHC: 35.3 g/dL (ref 30.0–36.0)
MCV: 92.3 fL (ref 80.0–100.0)
Monocytes Absolute: 2.4 10*3/uL — ABNORMAL HIGH (ref 0.1–1.0)
Monocytes Relative: 15 %
Neutro Abs: 11.6 10*3/uL — ABNORMAL HIGH (ref 1.7–7.7)
Neutrophils Relative %: 73 %
Platelets: 206 10*3/uL (ref 150–400)
RBC: 4.05 MIL/uL (ref 3.87–5.11)
RDW: 13.2 % (ref 11.5–15.5)
WBC: 16 10*3/uL — ABNORMAL HIGH (ref 4.0–10.5)
nRBC: 0 % (ref 0.0–0.2)

## 2021-04-30 LAB — COMPREHENSIVE METABOLIC PANEL
ALT: 71 U/L — ABNORMAL HIGH (ref 0–44)
AST: 59 U/L — ABNORMAL HIGH (ref 15–41)
Albumin: 2.7 g/dL — ABNORMAL LOW (ref 3.5–5.0)
Alkaline Phosphatase: 68 U/L (ref 38–126)
Anion gap: 12 (ref 5–15)
BUN: 37 mg/dL — ABNORMAL HIGH (ref 8–23)
CO2: 26 mmol/L (ref 22–32)
Calcium: 8.9 mg/dL (ref 8.9–10.3)
Chloride: 95 mmol/L — ABNORMAL LOW (ref 98–111)
Creatinine, Ser: 1.28 mg/dL — ABNORMAL HIGH (ref 0.44–1.00)
GFR, Estimated: 43 mL/min — ABNORMAL LOW (ref >60)
Glucose, Bld: 90 mg/dL (ref 70–99)
Potassium: 3.2 mmol/L — ABNORMAL LOW (ref 3.5–5.1)
Sodium: 133 mmol/L — ABNORMAL LOW (ref 135–145)
Total Bilirubin: 0.5 mg/dL (ref 0.3–1.2)
Total Protein: 5.6 g/dL — ABNORMAL LOW (ref 6.5–8.1)

## 2021-04-30 LAB — PROCALCITONIN: Procalcitonin: 3.61 ng/mL

## 2021-04-30 LAB — BRAIN NATRIURETIC PEPTIDE: B Natriuretic Peptide: 123.2 pg/mL — ABNORMAL HIGH (ref 0.0–100.0)

## 2021-04-30 LAB — MAGNESIUM: Magnesium: 1.5 mg/dL — ABNORMAL LOW (ref 1.7–2.4)

## 2021-04-30 LAB — C-REACTIVE PROTEIN: CRP: 9.1 mg/dL — ABNORMAL HIGH (ref <1.0)

## 2021-04-30 MED ORDER — MELATONIN 5 MG PO TABS
5.0000 mg | ORAL_TABLET | Freq: Once | ORAL | Status: AC
  Administered 2021-04-30: 5 mg via ORAL
  Filled 2021-04-30: qty 1

## 2021-04-30 MED ORDER — FUROSEMIDE 40 MG PO TABS
40.0000 mg | ORAL_TABLET | Freq: Once | ORAL | Status: AC
  Administered 2021-04-30: 40 mg via ORAL
  Filled 2021-04-30: qty 1

## 2021-04-30 MED ORDER — MAGNESIUM SULFATE 4 GM/100ML IV SOLN
4.0000 g | Freq: Once | INTRAVENOUS | Status: AC
  Administered 2021-04-30: 4 g via INTRAVENOUS
  Filled 2021-04-30: qty 100

## 2021-04-30 MED ORDER — POTASSIUM CHLORIDE CRYS ER 20 MEQ PO TBCR
40.0000 meq | EXTENDED_RELEASE_TABLET | Freq: Two times a day (BID) | ORAL | Status: AC
  Administered 2021-04-30 (×2): 40 meq via ORAL
  Filled 2021-04-30 (×2): qty 2

## 2021-04-30 NOTE — Progress Notes (Signed)
Pt attempting to get out of bed. Pt taking off all medical devices constantly. Pt reoriented. Pt continues to pull medical devices. Pt is very agitated. Pt repositioned. Pt continues to get out of bed.

## 2021-04-30 NOTE — Progress Notes (Signed)
Happy hands overlay given to patient for distraction. Pt can not be reoriented.

## 2021-04-30 NOTE — TOC Benefit Eligibility Note (Signed)
Patient Product/process development scientist completed.    The patient is currently admitted and upon discharge could be taking Eliquis 5 mg.  The current 30 day co-pay is, $10.35.   The patient is insured through [employer] Medicare Part D     Roland Earl, CPhT Pharmacy Patient Advocate Specialist [facility] Patient Advocate Team Direct Number: [phone]  Fax: [phone]

## 2021-04-30 NOTE — TOC Progression Note (Addendum)
Transition of Care [facility]) - Progression Note    Patient Details  Name: ILARIA MUCH MRN: [id number] Date of Birth: 06-Feb-1946  Transition of Care [facility]) CM/SW Contact  Mearl Latin, LCSW Phone Number: 04/30/2021, 9:30 AM  Clinical Narrative:    9:30am-CSW faxed referral to [facility] contracted SNFs, no bed offers currently.   4pm-CSW received a call from patient's daughter-in-law, Jeanice Lim (and son Fayrene Fearing) and was able to provide SNF bed offers. They will research facilities and let CSW know their choice tomorrow.    Expected Discharge Plan: Skilled Nursing Facility Barriers to Discharge: Continued Medical Work up, SNF Pending bed offer, English as a second language teacher  Expected Discharge Plan and Services Expected Discharge Plan: Skilled Nursing Facility In-house Referral: Clinical Social Work                                             Social Determinants of Health (SDOH) Interventions    Readmission Risk Interventions No flowsheet data found.

## 2021-04-30 NOTE — Progress Notes (Signed)
MD notified of pt's recent behavior and attempts to get out of bed. New orders noted.

## 2021-04-30 NOTE — Progress Notes (Signed)
Cardiac monitor discontinued 2/5 per Dr Candiss Norse

## 2021-04-30 NOTE — Progress Notes (Signed)
PROGRESS NOTE                                                                                                                                                                                                             Patient Demographics:    Christie Sanders, is a 76 y.o. female, DOB - December 23, 1945, [id number]  Outpatient Primary MD for the patient is Nodal, Alphonzo Dublin, PA-C    LOS - 4  Admit date - 04/26/2021    Chief Complaint  Patient presents with   Altered Mental Status       Brief Narrative (HPI from H&P)  - 76 year old African-American female with reported history of falling 1 day before admission while going to the kitchen.  Reportedly had hit her head.  Also has had dysuria for about 3 weeks.  I talked to her son Christie Sanders on 04/27/2021 in detail, according to him she was having dysuria where her urine was burning and had a foul smell for the last few weeks, the last 3 to 4 days prior to hospital admission she was not feeling well and weak, she fell 2 days prior to hospital admission, family was trying to treat her the best at home but since she continued to get weaker they brought her to the ER.  Son also adds that she has been having some generalized headache off and on for the last 4 to 6 weeks, she was also diagnosed with dementia several years ago has been pretty mild thus far.  In the ER she was diagnosed with sepsis due to UTI, new onset A. fib RVR and she was admitted to the hospital.   Subjective:   Patient in bed, appears comfortable, denies any headache, no fever, no chest pain or pressure, no shortness of breath , no abdominal pain. No new focal weakness.   Assessment  & Plan :    Sepsis with AKI, dehydration caused by E. Coli UTI . She is getting IV fluids, sepsis pathophysiology has resolved, she has been adequately hydrated, E. coli is pansensitive continue IV Rocephin.  Continue PT OT monitor may require  SNF.  Blood cultures surprisingly negative despite very high procalcitonin.  2.  AKI with Hyponatremia.  Got worse with IV fluids, improved after diuretics, continue gentle diuresis on 04/28/2021 and monitor.  3.  Newly diagnosed A. fib  RVR.  Could be paroxysmal or stress-induced.  [place] vas 2 score is be greater than 3.  She was initially on Cardizem and heparin drip now transition to oral Cardizem and Eliquis.  Stable echocardiogram and TSH.  4.  Alzheimer's dementia.  Per son has been getting increasingly more confused over the last month or so, supportive care, will remain at risk for delirium in the hospital, minimize narcotics and benzodiazepine.  Head CT is unremarkable no focal deficits for now.  Resumed Aricept and Namenda, per son she has not been taking it for a while.  5.  Acute hypoxic respiratory failure in the ER.  Required BiPAP ?,  Now stable on room air after diuresis.  Could have been mild acute on chronic diastolic CHF in the ER.  EF 55%.  6.  Dyslipidemia.  Resumed home dose statin.  7.  GERD.  PPI.         Condition - Extremely Guarded  Family Communication  :  son Fayrene FearingJames [phone] 04/27/21 - DNR, 04/28/21, 04/29/21  Code Status :  DNR  Consults  : None  PUD Prophylaxis :     Procedures  :     TTE - 1. Left ventricular ejection fraction, by estimation, is 50 to 55%. The left ventricle has low normal function. The left ventricle has no regional wall motion abnormalities. Left ventricular diastolic parameters are indeterminate.  2. Right ventricular systolic function is normal. The right ventricular size is normal. There is normal pulmonary artery systolic pressure.  3. The mitral valve is degenerative. Mild mitral valve regurgitation. No evidence of mitral stenosis. Moderate mitral annular calcification.  4. The aortic valve is tricuspid. There is moderate calcification of the aortic valve. There is moderate thickening of the aortic valve. Aortic valve regurgitation is  mild. Aortic valve sclerosis/calcification is present, without any evidence of aortic stenosis.  5. The inferior vena cava is normal in size with greater than 50% respiratory variability, suggesting right atrial pressure of 3 mmHg.   CT Head - Non Acute      Disposition Plan  :    Status is: Inpatient  DVT Prophylaxis  :    SCDs Start: 04/27/21 0049 SCDs Start: 04/27/21 0049 apixaban (ELIQUIS) tablet 5 mg    Lab Results  Component Value Date   PLT 206 04/30/2021    Diet :  Diet Order             Diet regular Room service appropriate? Yes with Assist; Fluid consistency: Thin; Fluid restriction: 1200 mL Fluid  Diet effective now                    Inpatient Medications  Scheduled Meds:  apixaban  5 mg Oral BID   cyclobenzaprine  5 mg Oral QHS   diltiazem  90 mg Oral Q8H   donepezil  10 mg Oral QHS   memantine  10 mg Oral BID   ondansetron (ZOFRAN) IV  4 mg Intravenous Once   pantoprazole  40 mg Oral Daily   potassium chloride  40 mEq Oral BID   pravastatin  40 mg Oral q1800   venlafaxine  75 mg Oral TID WC   Continuous Infusions:  cefTRIAXone (ROCEPHIN)  IV 2 g (04/29/21 0836)   PRN Meds:.acetaminophen, diltiazem, levalbuterol  Antibiotics  :    Anti-infectives (From admission, onward)    Start     Dose/Rate Route Frequency Ordered Stop   04/27/21 2000  vancomycin (VANCOREADY) IVPB 1250 mg/250  mL  Status:  Discontinued        1,250 mg 166.7 mL/hr over 90 Minutes Intravenous Every 24 hours 04/26/21 1742 04/27/21 0048   04/27/21 0800  cefTRIAXone (ROCEPHIN) 2 g in sodium chloride 0.9 % 100 mL IVPB        2 g 200 mL/hr over 30 Minutes Intravenous Every 24 hours 04/27/21 0048     04/26/21 1930  vancomycin (VANCOCIN) IVPB 1000 mg/200 mL premix        1,000 mg 200 mL/hr over 60 Minutes Intravenous  Once 04/26/21 1742 04/26/21 2144   04/26/21 1830  vancomycin (VANCOCIN) IVPB 1000 mg/200 mL premix        1,000 mg 200 mL/hr over 60 Minutes Intravenous  Once  04/26/21 1742 04/26/21 1905   04/26/21 1800  ceFEPIme (MAXIPIME) 2 g in sodium chloride 0.9 % 100 mL IVPB  Status:  Discontinued        2 g 200 mL/hr over 30 Minutes Intravenous Every 12 hours 04/26/21 1742 04/27/21 0048   04/26/21 1500  cefTRIAXone (ROCEPHIN) 1 g in sodium chloride 0.9 % 100 mL IVPB        1 g 200 mL/hr over 30 Minutes Intravenous  Once 04/26/21 1458 04/26/21 1947        Time Spent in minutes  30   Lala Lund M.D on 04/30/2021 at 12:14 PM  To page go to [email or web address]   [facility] -  Office  [phone]  See all Orders from today for further details    Objective:   Vitals:   04/29/21 2035 04/30/21 0014 04/30/21 0408 04/30/21 0757  BP: (!) 142/83 (!) 149/95 131/87 111/71  Pulse: 85 88 83 87  Resp: 20 20 18 18   Temp: 97.6 F (36.4 C) 98.2 F (36.8 C) 97.8 F (36.6 C) 98.1 F (36.7 C)  TempSrc: Oral Oral Oral Oral  SpO2: 100% 100% 100%   Weight:      Height:        Wt Readings from Last 3 Encounters:  04/29/21 88.3 kg  09/07/13 108.5 kg     Intake/Output Summary (Last 24 hours) at 04/30/2021 1214 Last data filed at 04/30/2021 1201 Gross per 24 hour  Intake 400 ml  Output 1850 ml  Net -1450 ml     Physical Exam  Awake Alert x 1, No new F.N deficits, intermittently gets confused but easily reoriented [place].AT,PERRAL Supple Neck, No JVD,   Symmetrical Chest wall movement, Good air movement bilaterally, CTAB RRR,No Gallops, Rubs or new Murmurs,  +ve B.Sounds, Abd Soft, No tenderness,   No Cyanosis, Clubbing or edema       Data Review:    CBC Recent Labs  Lab 04/26/21 1412 04/27/21 0109 04/28/21 0036 04/29/21 0149 04/30/21 0227  WBC 18.4* 18.8* 13.0* 14.6* 16.0*  HGB 12.6 12.5 12.6 12.5 13.2  HCT 37.0 36.0 36.7 35.1* 37.4  PLT 193 148* 126* 172 206  MCV 92.7 94.0 92.4 91.6 92.3  MCH 31.6 32.6 31.7 32.6 32.6  MCHC 34.1 34.7 34.3 35.6 35.3  RDW 13.2 13.2 13.2 13.0 13.2  LYMPHSABS 1.1 0.4* 0.6* 0.9 1.7  MONOABS 1.5* 0.2  0.8 1.5* 2.4*  EOSABS 0.1 0.0 0.4 0.0 0.0  BASOSABS 0.0 0.0 0.0 0.0 0.0    Electrolytes Recent Labs  Lab 04/26/21 1412 04/26/21 1555 04/26/21 1811 04/27/21 0109 04/28/21 0036 04/29/21 0149 04/30/21 0227  NA 128*  --   --  126* 129* 129* 133*  K 2.7*  --   --  3.4* 2.9* 4.3 3.2*  CL 91*  --   --  94* 93* 94* 95*  CO2 24  --   --  20* 23 25 26   GLUCOSE 130*  --   --  158* 141* 116* 90  BUN 23  --   --  26* 32* 37* 37*  CREATININE 1.06*  --   --  1.39* 1.43* 1.22* 1.28*  CALCIUM 9.3  --   --  8.7* 8.6* 8.8* 8.9  AST  --   --   --   --  36 63* 59*  ALT  --   --   --   --  29 52* 71*  ALKPHOS  --   --   --   --  86 80 68  BILITOT  --   --   --   --  0.6 0.9 0.5  ALBUMIN  --   --   --   --  2.7* 2.5* 2.7*  MG 1.4*  --   --  1.9 1.8 1.9 1.5*  CRP  --   --   --   --  33.8* 19.0* 9.1*  PROCALCITON  --   --   --  25.23 18.66 7.50 3.61  LATICACIDVEN  --  3.4* 2.1*  --   --   --   --   TSH  --   --   --  1.058  --   --   --   BNP 221.7*  --   --  188.9* 686.2* 141.0* 123.2*    ------------------------------------------------------------------------------------------------------------------ No results for input(s): CHOL, HDL, LDLCALC, TRIG, CHOLHDL, LDLDIRECT in the last 72 hours.  No results found for: HGBA1C  No results for input(s): TSH, T4TOTAL, T3FREE, THYROIDAB in the last 72 hours.  Invalid input(s): FREET3  ------------------------------------------------------------------------------------------------------------------ ID Labs Recent Labs  Lab 04/26/21 1412 04/26/21 1555 04/26/21 1811 04/27/21 0109 04/28/21 0036 04/29/21 0149 04/30/21 0227  WBC 18.4*  --   --  18.8* 13.0* 14.6* 16.0*  PLT 193  --   --  148* 126* 172 206  CRP  --   --   --   --  33.8* 19.0* 9.1*  PROCALCITON  --   --   --  25.23 18.66 7.50 3.61  LATICACIDVEN  --  3.4* 2.1*  --   --   --   --   CREATININE 1.06*  --   --  1.39* 1.43* 1.22* 1.28*   Cardiac Enzymes No results for input(s): CKMB,  TROPONINI, MYOGLOBIN in the last 168 hours.  Invalid input(s): CK   Radiology Reports DG Chest 2 View  Result Date: 04/26/2021 CLINICAL DATA:  Shortness of breath. EXAM: CHEST - 2 VIEW COMPARISON:  Chest CTA dated 11/12/2017. Chest radiographs dated 11/02/2014. FINDINGS: Interval borderline enlarged heart. Tortuous aorta. Interval small amount of pleural fluid or thickening at the left lateral costophrenic angle. Minimal atelectasis or scarring at the right lateral costophrenic angle. Lumbar spine fixation hardware. Thoracic spine degenerative changes. IMPRESSION: 1. Interval borderline cardiomegaly. 2. Interval small amount of left pleural fluid or thickening. 3. Interval minimal atelectasis or scarring at the right lateral costophrenic angle. Electronically Signed   By: Claudie Revering M.D.   On: 04/26/2021 12:48   CT Head Wo Contrast  Result Date: 04/26/2021 CLINICAL DATA:  Head trauma EXAM: CT HEAD WITHOUT CONTRAST TECHNIQUE: Contiguous axial images were obtained from the base of the skull through the vertex without intravenous contrast. RADIATION DOSE REDUCTION: This exam was performed according  to the departmental dose-optimization program which includes automated exposure control, adjustment of the mA and/or kV according to patient size and/or use of iterative reconstruction technique. COMPARISON:  Head CT dated December 06, 2006 FINDINGS: Brain: Punctate right basal ganglia calcification. No evidence of acute infarction, hemorrhage, hydrocephalus, extra-axial collection or mass lesion/mass effect. Vascular: No hyperdense vessel or unexpected calcification. Skull: Normal. Negative for fracture or focal lesion. Sinuses/Orbits: No acute finding. Other: None. IMPRESSION: No acute intracranial abnormality. Electronically Signed   By: Yetta Glassman M.D.   On: 04/26/2021 14:35   DG Chest Port 1 View  Result Date: 04/28/2021 CLINICAL DATA:  Shortness of breath. EXAM: PORTABLE CHEST 1 VIEW COMPARISON:   04/27/2020 FINDINGS: 0633 hours. The cardio pericardial silhouette is enlarged. The lungs are clear without focal pneumonia, edema, pneumothorax or pleural effusion. Interstitial markings are diffusely coarsened with chronic features. Bones are diffusely demineralized. Telemetry leads overlie the chest. IMPRESSION: No acute cardiopulmonary findings. Electronically Signed   By: Misty Stanley M.D.   On: 04/28/2021 07:33   DG Chest Port 1 View  Result Date: 04/27/2021 CLINICAL DATA:  76 year old female with shortness of breath. Sepsis. EXAM: PORTABLE CHEST 1 VIEW COMPARISON:  Portable chest 04/26/2021 and earlier. FINDINGS: Portable AP semi upright view at 0557 hours. Slightly lower lung volumes. Stable cardiac and mediastinal contours, no cardiomegaly. Visualized tracheal air column is within normal limits. Mild blunting of the left lung base suggesting small left pleural effusion. But otherwise Allowing for portable technique the lungs are clear. No acute osseous abnormality identified. Left glenohumeral degeneration. Partially visible lumbar spine hardware. Negative visible bowel gas. IMPRESSION: Small left pleural effusion suspected but no other acute cardiopulmonary abnormality. Electronically Signed   By: Genevie Ann M.D.   On: 04/27/2021 06:43   DG Chest Portable 1 View  Result Date: 04/26/2021 CLINICAL DATA:  Dyspnea EXAM: PORTABLE CHEST 1 VIEW COMPARISON:  12:30 p.m. FINDINGS: Lungs are well expanded, symmetric, and clear. No pneumothorax or pleural effusion. Cardiac size within normal limits. Pulmonary vascularity is normal. Osseous structures are age-appropriate. No acute bone abnormality. IMPRESSION: No active disease. Electronically Signed   By: Fidela Salisbury M.D.   On: 04/26/2021 20:25   ECHOCARDIOGRAM COMPLETE  Result Date: 04/27/2021    ECHOCARDIOGRAM REPORT   Patient Name:   AMYRI LOCOCO Date of Exam: 04/27/2021 Medical Rec #:  [id number]      Height:       60.0 in Accession #:    [id number]      Weight:       239.2 lb Date of Birth:  1945-10-19      BSA:          2.014 m Patient Age:    75 years       BP:           121/89 mmHg Patient Gender: F              HR:           99 bpm. Exam Location:  Inpatient Procedure: 2D Echo, Cardiac Doppler and Color Doppler Indications:    I48.91 AFIB  History:        Patient has no prior history of Echocardiogram examinations.                 Risk Factors:Hypertension and Dyslipidemia.  Sonographer:    Beryle Beams Referring Phys: [place]  1. Left ventricular ejection fraction, by estimation, is 50 to 55%. The left ventricle  has low normal function. The left ventricle has no regional wall motion abnormalities. Left ventricular diastolic parameters are indeterminate.  2. Right ventricular systolic function is normal. The right ventricular size is normal. There is normal pulmonary artery systolic pressure.  3. The mitral valve is degenerative. Mild mitral valve regurgitation. No evidence of mitral stenosis. Moderate mitral annular calcification.  4. The aortic valve is tricuspid. There is moderate calcification of the aortic valve. There is moderate thickening of the aortic valve. Aortic valve regurgitation is mild. Aortic valve sclerosis/calcification is present, without any evidence of aortic stenosis.  5. The inferior vena cava is normal in size with greater than 50% respiratory variability, suggesting right atrial pressure of 3 mmHg. FINDINGS  Left Ventricle: Left ventricular ejection fraction, by estimation, is 50 to 55%. The left ventricle has low normal function. The left ventricle has no regional wall motion abnormalities. The left ventricular internal cavity size was normal in size. There is no left ventricular hypertrophy. Left ventricular diastolic parameters are indeterminate. Right Ventricle: The right ventricular size is normal. No increase in right ventricular wall thickness. Right ventricular systolic function is normal. There is normal  pulmonary artery systolic pressure. The tricuspid regurgitant velocity is 2.74 m/s, and  with an assumed right atrial pressure of 3 mmHg, the estimated right ventricular systolic pressure is 33.0 mmHg. Left Atrium: Left atrial size was normal in size. Right Atrium: Right atrial size was normal in size. Pericardium: There is no evidence of pericardial effusion. Mitral Valve: The mitral valve is degenerative in appearance. There is moderate thickening of the mitral valve leaflet(s). There is moderate calcification of the mitral valve leaflet(s). Moderate mitral annular calcification. Mild mitral valve regurgitation. No evidence of mitral valve stenosis. MV peak gradient, 82.1 mmHg. The mean mitral valve gradient is 56.0 mmHg. Tricuspid Valve: The tricuspid valve is normal in structure. Tricuspid valve regurgitation is mild . No evidence of tricuspid stenosis. Aortic Valve: The aortic valve is tricuspid. There is moderate calcification of the aortic valve. There is moderate thickening of the aortic valve. Aortic valve regurgitation is mild. Aortic regurgitation PHT measures 617 msec. Aortic valve sclerosis/calcification is present, without any evidence of aortic stenosis. Aortic valve mean gradient measures 4.0 mmHg. Aortic valve peak gradient measures 8.0 mmHg. Aortic valve area, by VTI measures 2.36 cm. Pulmonic Valve: The pulmonic valve was normal in structure. Pulmonic valve regurgitation is not visualized. No evidence of pulmonic stenosis. Aorta: The aortic root is normal in size and structure. Venous: The inferior vena cava is normal in size with greater than 50% respiratory variability, suggesting right atrial pressure of 3 mmHg. IAS/Shunts: No atrial level shunt detected by color flow Doppler.  LEFT VENTRICLE PLAX 2D LVIDd:         4.20 cm     Diastology LVIDs:         2.40 cm     LV e' medial:  13.80 cm/s LV PW:         1.10 cm     LV e' lateral: 9.68 cm/s LV IVS:        1.00 cm LVOT diam:     2.10 cm LV SV:          45 LV SV Index:   22 LVOT Area:     3.46 cm  LV Volumes (MOD) LV vol d, MOD A2C: 65.3 ml LV vol d, MOD A4C: 54.7 ml LV vol s, MOD A2C: 24.3 ml LV vol s, MOD A4C: 19.5 ml LV  SV MOD A2C:     41.0 ml LV SV MOD A4C:     54.7 ml LV SV MOD BP:      39.7 ml RIGHT VENTRICLE            IVC RV S prime:     9.90 cm/s  IVC diam: 2.10 cm TAPSE (M-mode): 1.9 cm LEFT ATRIUM             Index        RIGHT ATRIUM           Index LA diam:        3.60 cm 1.79 cm/m   RA Area:     15.10 cm LA Vol (A2C):   67.7 ml 33.61 ml/m  RA Volume:   32.50 ml  16.14 ml/m LA Vol (A4C):   43.2 ml 21.45 ml/m LA Biplane Vol: 57.2 ml 28.40 ml/m  AORTIC VALVE                    PULMONIC VALVE AV Area (Vmax):    2.34 cm     PV Vmax:       0.58 m/s AV Area (Vmean):   2.23 cm     PV Vmean:      32.800 cm/s AV Area (VTI):     2.36 cm     PV VTI:        0.079 m AV Vmax:           141.00 cm/s  PV Peak grad:  1.4 mmHg AV Vmean:          95.600 cm/s  PV Mean grad:  1.0 mmHg AV VTI:            0.191 m AV Peak Grad:      8.0 mmHg AV Mean Grad:      4.0 mmHg LVOT Vmax:         95.40 cm/s LVOT Vmean:        61.600 cm/s LVOT VTI:          0.130 m LVOT/AV VTI ratio: 0.68 AI PHT:            617 msec  AORTA Ao Root diam: 3.30 cm MITRAL VALVE              TRICUSPID VALVE MV Area VTI:  0.36 cm    TV Peak grad:   29.4 mmHg MV Peak grad: 82.1 mmHg   TV Mean grad:   21.0 mmHg MV Mean grad: 56.0 mmHg   TV Vmax:        2.71 m/s MV Vmax:      4.53 m/s    TV Vmean:       214.0 cm/s MV Vmean:     352.0 cm/s  TV VTI:         0.74 msec                           TR Peak grad:   30.0 mmHg                           TR Vmax:        274.00 cm/s                            SHUNTS  Systemic VTI:  0.13 m                           Systemic Diam: 2.10 cm Jenkins Rouge MD Electronically signed by Jenkins Rouge MD Signature Date/Time: 04/27/2021/12:12:04 PM    Final

## 2021-04-30 NOTE — Progress Notes (Signed)
PHARMACY - PHYSICIAN COMMUNICATION CRITICAL VALUE ALERT - BLOOD CULTURE IDENTIFICATION (BCID)  Christie Sanders is an 76 y.o. female who presented to [facility] on 04/26/2021 with a chief complaint of altered mental status.   Assessment:  Patient admitted s/p fall with altered mental status. Family noted dysuria for the past few weeks. Patient now with positive urine and blood cultures growing ecoli.   Name of physician (or Provider) Contacted: Toniann Fail  Current antibiotics: ceftriaxone 2g q24 hours  Changes to prescribed antibiotics recommended:  Patient is on recommended antibiotics - No changes needed  No results found for this or any previous visit.  Sheppard Coil PharmD., BCPS Clinical Pharmacist 04/30/2021 7:38 PM

## 2021-04-30 NOTE — Progress Notes (Signed)
Occupational Therapy Treatment Patient Details Name: Christie Sanders MRN: [id number] DOB: 01-18-1946 Today's Date: 04/30/2021   History of present illness 76 yo female with onset of a fall at home in which she struck her head was admitted on 2.2 with ongoing UTI and noted acute resp failure with A-fib and RVR due to sepsis.  Pt is weak and on O2, has worsened symptoms of HR and breathing struggles to contend with.  PMHx:  Alz dementia, HA's, fibromyalgia, HTN   OT comments  Patient received in supine and nursing reported patient attempting to get out of bed. Patient's breakfast try was at bedside and patient was asked if she would like to sit on EOB to eat. Patient stated she did not want help but would not attempt to get up.  Patient provided washcloth and performed hand and face hygiene at bed level. Further attemptsw made to have patient get to EOB with patient confusing therapist with family member. Acute OT to continue to follow to increase participation.    Recommendations for follow up therapy are one component of a multi-disciplinary discharge planning process, led by the attending physician.  Recommendations may be updated based on patient status, additional functional criteria and insurance authorization.    Follow Up Recommendations  Skilled nursing-short term rehab (<3 hours/day)    Assistance Recommended at Discharge Frequent or constant Supervision/Assistance  Patient can return home with the following  Two people to help with walking and/or transfers;Two people to help with bathing/dressing/bathroom;Assistance with cooking/housework;Direct supervision/assist for medications management;Direct supervision/assist for financial management;Assist for transportation   Equipment Recommendations  None recommended by OT    Recommendations for Other Services      Precautions / Restrictions Precautions Precautions: Fall Precaution Comments: monitor HR and O2 sats       Mobility Bed  Mobility Overal bed mobility: Needs Assistance Bed Mobility: Rolling Rolling: Supervision         General bed mobility comments: patient able to position self in bed to allow for breakfast setup    Transfers                         Balance                                           ADL either performed or assessed with clinical judgement   ADL Overall ADL's : Needs assistance/impaired Eating/Feeding: Set up;Bed level Eating/Feeding Details (indicate cue type and reason): patient provided setup for breakfast but would only drink coffee Grooming: Set up;Bed level Grooming Details (indicate cue type and reason): performed hand and face hygiene                               General ADL Comments: patient not following directions well for getting out of bed    Extremity/Trunk Assessment              Vision       Perception     Praxis      Cognition Arousal/Alertness: Awake/alert Behavior During Therapy: Agitated, Anxious Overall Cognitive Status: History of cognitive impairments - at baseline  General Comments: Patient oriented to self only, stated she will get up and take shower but would not follow directions or attempt to get to EOB with therapist        Exercises      Shoulder Instructions       General Comments      Pertinent Vitals/ Pain       Pain Assessment Pain Assessment: No/denies pain  Home Living                                          Prior Functioning/Environment              Frequency  Min 2X/week        Progress Toward Goals  OT Goals(current goals can now be found in the care plan section)  Progress towards OT goals: Not progressing toward goals - comment (due to limited participation)  Acute Rehab OT Goals OT Goal Formulation: Patient unable to participate in goal setting Time For Goal Achievement:  05/11/21 Potential to Achieve Goals: Fair ADL Goals Pt Will Perform Grooming: with set-up;sitting Pt Will Perform Lower Body Bathing: with mod assist;sitting/lateral leans;sit to/from stand Pt Will Perform Lower Body Dressing: with mod assist;sitting/lateral leans;sit to/from stand;with adaptive equipment Pt Will Transfer to Toilet: with mod assist;stand pivot transfer Pt Will Perform Toileting - Clothing Manipulation and hygiene: with mod assist;with adaptive equipment;sitting/lateral leans;sit to/from stand Additional ADL Goal #1: Pt will complete all aspects of bed mobility with min A as a precursor to seated ADL's Additional ADL Goal #2: Pt will tolerate sitting EOB for 5 mins to improve activity tolerance.  Plan Discharge plan remains appropriate    Co-evaluation                 AM-PAC OT "6 Clicks" Daily Activity     Outcome Measure   Help from another person eating meals?: A Little Help from another person taking care of personal grooming?: A Little Help from another person toileting, which includes using toliet, bedpan, or urinal?: Total Help from another person bathing (including washing, rinsing, drying)?: A Lot Help from another person to put on and taking off regular upper body clothing?: A Little Help from another person to put on and taking off regular lower body clothing?: Total 6 Click Score: 13    End of Session Equipment Utilized During Treatment: Oxygen  OT Visit Diagnosis: Unsteadiness on feet (R26.81);Other abnormalities of gait and mobility (R26.89);Muscle weakness (generalized) (M62.81)   Activity Tolerance Treatment limited secondary to agitation   Patient Left in bed;with call bell/phone within reach;with bed alarm set   Nurse Communication Other (comment) (unable to get patient to eob)        Time: 2993-7169 OT Time Calculation (min): 15 min  Charges: OT General Charges $OT Visit: 1 Visit OT Treatments $Self Care/Home Management : 8-22  mins  Alfonse Flavors, OTA Acute Rehabilitation Services  Pager [phone] Office [phone]   Dewain Penning 04/30/2021, 10:23 AM

## 2021-04-30 NOTE — Plan of Care (Signed)

## 2021-05-01 LAB — COMPREHENSIVE METABOLIC PANEL
ALT: 50 U/L — ABNORMAL HIGH (ref 0–44)
AST: 24 U/L (ref 15–41)
Albumin: 3 g/dL — ABNORMAL LOW (ref 3.5–5.0)
Alkaline Phosphatase: 72 U/L (ref 38–126)
Anion gap: 9 (ref 5–15)
BUN: 33 mg/dL — ABNORMAL HIGH (ref 8–23)
CO2: 30 mmol/L (ref 22–32)
Calcium: 8.9 mg/dL (ref 8.9–10.3)
Chloride: 95 mmol/L — ABNORMAL LOW (ref 98–111)
Creatinine, Ser: 1.06 mg/dL — ABNORMAL HIGH (ref 0.44–1.00)
GFR, Estimated: 54 mL/min — ABNORMAL LOW (ref >60)
Glucose, Bld: 89 mg/dL (ref 70–99)
Potassium: 3.9 mmol/L (ref 3.5–5.1)
Sodium: 134 mmol/L — ABNORMAL LOW (ref 135–145)
Total Bilirubin: 0.6 mg/dL (ref 0.3–1.2)
Total Protein: 6.4 g/dL — ABNORMAL LOW (ref 6.5–8.1)

## 2021-05-01 LAB — MAGNESIUM: Magnesium: 2.2 mg/dL (ref 1.7–2.4)

## 2021-05-01 MED ORDER — FUROSEMIDE 40 MG PO TABS
40.0000 mg | ORAL_TABLET | Freq: Once | ORAL | Status: AC
  Administered 2021-05-01: 40 mg via ORAL
  Filled 2021-05-01: qty 1

## 2021-05-01 MED ORDER — CARVEDILOL 6.25 MG PO TABS
6.2500 mg | ORAL_TABLET | Freq: Two times a day (BID) | ORAL | Status: DC
  Administered 2021-05-01 – 2021-05-04 (×8): 6.25 mg via ORAL
  Filled 2021-05-01 (×8): qty 1

## 2021-05-01 MED ORDER — AMLODIPINE BESYLATE 5 MG PO TABS
5.0000 mg | ORAL_TABLET | Freq: Every day | ORAL | Status: DC
  Filled 2021-05-01: qty 1

## 2021-05-01 MED ORDER — POTASSIUM CHLORIDE CRYS ER 20 MEQ PO TBCR: 40.0000 meq | EXTENDED_RELEASE_TABLET | Freq: Once | ORAL | Status: DC

## 2021-05-01 MED ORDER — AMLODIPINE BESYLATE 5 MG PO TABS
5.0000 mg | ORAL_TABLET | Freq: Every day | ORAL | Status: DC
  Administered 2021-05-01 – 2021-05-04 (×4): 5 mg via ORAL
  Filled 2021-05-01 (×4): qty 1

## 2021-05-01 MED ORDER — POTASSIUM CHLORIDE CRYS ER 20 MEQ PO TBCR
20.0000 meq | EXTENDED_RELEASE_TABLET | Freq: Once | ORAL | Status: AC
  Administered 2021-05-01: 20 meq via ORAL
  Filled 2021-05-01: qty 1

## 2021-05-01 NOTE — Progress Notes (Signed)
PROGRESS NOTE                                                                                                                                                                                                             Patient Demographics:    Christie Sanders, is a 76 y.o. female, DOB - 08-13-1945, [id number]  Outpatient Primary MD for the patient is Nodal, Alphonzo Dublin, PA-C    LOS - 5  Admit date - 04/26/2021    Chief Complaint  Patient presents with   Altered Mental Status       Brief Narrative (HPI from H&P)  - 76 year old African-American female with reported history of falling 1 day before admission while going to the kitchen.  Reportedly had hit her head.  Also has had dysuria for about 3 weeks.  I talked to her son Christie Sanders on 04/27/2021 in detail, according to him she was having dysuria where her urine was burning and had a foul smell for the last few weeks, the last 3 to 4 days prior to hospital admission she was not feeling well and weak, she fell 2 days prior to hospital admission, family was trying to treat her the best at home but since she continued to get weaker they brought her to the ER.  Son also adds that she has been having some generalized headache off and on for the last 4 to 6 weeks, she was also diagnosed with dementia several years ago has been pretty mild thus far.  In the ER she was diagnosed with sepsis due to UTI, new onset A. fib RVR and she was admitted to the hospital.   Subjective:   Patient in bed, appears comfortable, denies any headache, no fever, no chest pain or pressure, no shortness of breath , no abdominal pain. No new focal weakness.  She is pleasantly confused but answering questions appropriately.   Assessment  & Plan :    Sepsis with AKI, dehydration caused by E. Coli UTI . She is getting IV fluids, sepsis pathophysiology has resolved, she has been adequately hydrated, E. coli is  pansensitive continue IV Rocephin x 7 doses.  Continue PT OT , will need SNF.  Blood cultures surprisingly negative despite very high procalcitonin.  2.  AKI with Hyponatremia.  Due to acute on chronic diastolic CHF.  EF 55%.  Improving with diuresis, dosing Lasix daily.  3.  Newly diagnosed A. fib RVR.  Could be paroxysmal or stress-induced.  Mali vas 2 score is be greater than 3.  She was initially on Cardizem and heparin drip now transition to oral Cardizem and Eliquis.  Stable echocardiogram and TSH.  4.  Alzheimer's dementia.  Per son has been getting increasingly more confused over the last month or so, supportive care, will remain at risk for delirium in the hospital, minimize narcotics and benzodiazepine.  Head CT is unremarkable no focal deficits for now.  Resumed Aricept and Namenda, per son she has not been taking it for a while.  Remains at risk for metabolic encephalopathy/delirium in the hospital in unfamiliar settings.  5.  Acute hypoxic respiratory failure in the ER.  Required BiPAP ?  Due to acute on chronic diastolic CHF EF [id number].  Improved with diuresis.  6.  Dyslipidemia.  Resumed home dose statin.  7.  GERD.  PPI.         Condition - Extremely Guarded  Family Communication  :  son Christie Sanders [phone] 04/27/21 - DNR, 04/28/21, 04/29/21, 04/30/2021  Code Status :  DNR  Consults  : None  PUD Prophylaxis :     Procedures  :     TTE - 1. Left ventricular ejection fraction, by estimation, is 50 to 55%. The left ventricle has low normal function. The left ventricle has no regional wall motion abnormalities. Left ventricular diastolic parameters are indeterminate.  2. Right ventricular systolic function is normal. The right ventricular size is normal. There is normal pulmonary artery systolic pressure.  3. The mitral valve is degenerative. Mild mitral valve regurgitation. No evidence of mitral stenosis. Moderate mitral annular calcification.  4. The aortic valve is tricuspid. There is  moderate calcification of the aortic valve. There is moderate thickening of the aortic valve. Aortic valve regurgitation is mild. Aortic valve sclerosis/calcification is present, without any evidence of aortic stenosis.  5. The inferior vena cava is normal in size with greater than 50% respiratory variability, suggesting right atrial pressure of 3 mmHg.   CT Head - Non Acute      Disposition Plan  :    Status is: Inpatient  DVT Prophylaxis  :    SCDs Start: 04/27/21 0049 SCDs Start: 04/27/21 0049 apixaban (ELIQUIS) tablet 5 mg    Lab Results  Component Value Date   PLT 206 04/30/2021    Diet :  Diet Order             Diet regular Room service appropriate? Yes with Assist; Fluid consistency: Thin; Fluid restriction: 1200 mL Fluid  Diet effective now                    Inpatient Medications  Scheduled Meds:  amLODipine  5 mg Oral Daily   apixaban  5 mg Oral BID   carvedilol  6.25 mg Oral BID WC   cyclobenzaprine  5 mg Oral QHS   donepezil  10 mg Oral QHS   furosemide  40 mg Oral Once   memantine  10 mg Oral BID   ondansetron (ZOFRAN) IV  4 mg Intravenous Once   pantoprazole  40 mg Oral Daily   potassium chloride  20 mEq Oral Once   pravastatin  40 mg Oral q1800   venlafaxine  75 mg Oral TID WC   Continuous Infusions:  cefTRIAXone (ROCEPHIN)  IV 2 g (05/01/21 0810)   PRN Meds:.acetaminophen, diltiazem, levalbuterol  Antibiotics  :    Anti-infectives (From admission, onward)    Start     Dose/Rate Route Frequency Ordered Stop   04/27/21 2000  vancomycin (VANCOREADY) IVPB 1250 mg/250 mL  Status:  Discontinued        1,250 mg 166.7 mL/hr over 90 Minutes Intravenous Every 24 hours 04/26/21 1742 04/27/21 0048   04/27/21 0800  cefTRIAXone (ROCEPHIN) 2 g in sodium chloride 0.9 % 100 mL IVPB        2 g 200 mL/hr over 30 Minutes Intravenous Every 24 hours 04/27/21 0048     04/26/21 1930  vancomycin (VANCOCIN) IVPB 1000 mg/200 mL premix        1,000 mg 200 mL/hr  over 60 Minutes Intravenous  Once 04/26/21 1742 04/26/21 2144   04/26/21 1830  vancomycin (VANCOCIN) IVPB 1000 mg/200 mL premix        1,000 mg 200 mL/hr over 60 Minutes Intravenous  Once 04/26/21 1742 04/26/21 1905   04/26/21 1800  ceFEPIme (MAXIPIME) 2 g in sodium chloride 0.9 % 100 mL IVPB  Status:  Discontinued        2 g 200 mL/hr over 30 Minutes Intravenous Every 12 hours 04/26/21 1742 04/27/21 0048   04/26/21 1500  cefTRIAXone (ROCEPHIN) 1 g in sodium chloride 0.9 % 100 mL IVPB        1 g 200 mL/hr over 30 Minutes Intravenous  Once 04/26/21 1458 04/26/21 1947        Time Spent in minutes  30   Susa Raring M.D on 05/01/2021 at 10:12 AM  To page go to [email or web address]   [facility] -  Office  [phone]  See all Orders from today for further details    Objective:   Vitals:   05/01/21 0400 05/01/21 0514 05/01/21 0600 05/01/21 0801  BP: (!) 140/107 (!) 144/129 140/88 126/87  Pulse: 97  95 77  Resp: 19   18  Temp: 98.3 F (36.8 C)   (!) 97.1 F (36.2 C)  TempSrc: Oral   Oral  SpO2: 95%  100%   Weight:      Height:        Wt Readings from Last 3 Encounters:  04/29/21 88.3 kg  09/07/13 108.5 kg     Intake/Output Summary (Last 24 hours) at 05/01/2021 1012 Last data filed at 05/01/2021 0505 Gross per 24 hour  Intake 240 ml  Output 1150 ml  Net -910 ml     Physical Exam  Awake but pleasantly confused thinks that she is at home,, No new F.N deficits,   [place].AT,PERRAL Supple Neck, No JVD,   Symmetrical Chest wall movement, Good air movement bilaterally, CTAB RRR,No Gallops, Rubs or new Murmurs,  +ve B.Sounds, Abd Soft, No tenderness,   No Cyanosis, Clubbing or edema     Data Review:    CBC Recent Labs  Lab 04/26/21 1412 04/27/21 0109 04/28/21 0036 04/29/21 0149 04/30/21 0227  WBC 18.4* 18.8* 13.0* 14.6* 16.0*  HGB 12.6 12.5 12.6 12.5 13.2  HCT 37.0 36.0 36.7 35.1* 37.4  PLT 193 148* 126* 172 206  MCV 92.7 94.0 92.4 91.6 92.3  MCH 31.6  32.6 31.7 32.6 32.6  MCHC 34.1 34.7 34.3 35.6 35.3  RDW 13.2 13.2 13.2 13.0 13.2  LYMPHSABS 1.1 0.4* 0.6* 0.9 1.7  MONOABS 1.5* 0.2 0.8 1.5* 2.4*  EOSABS 0.1 0.0 0.4 0.0 0.0  BASOSABS 0.0 0.0 0.0 0.0 0.0    Electrolytes Recent Labs  Lab 04/26/21 1412 04/26/21 1555 04/26/21  1811 04/27/21 0109 04/28/21 0036 04/29/21 0149 04/30/21 0227 05/01/21 0040  NA 128*  --   --  126* 129* 129* 133* 134*  K 2.7*  --   --  3.4* 2.9* 4.3 3.2* 3.9  CL 91*  --   --  94* 93* 94* 95* 95*  CO2 24  --   --  20* 23 25 26 30   GLUCOSE 130*  --   --  158* 141* 116* 90 89  BUN 23  --   --  26* 32* 37* 37* 33*  CREATININE 1.06*  --   --  1.39* 1.43* 1.22* 1.28* 1.06*  CALCIUM 9.3  --   --  8.7* 8.6* 8.8* 8.9 8.9  AST  --   --   --   --  36 63* 59* 24  ALT  --   --   --   --  29 52* 71* 50*  ALKPHOS  --   --   --   --  86 80 68 72  BILITOT  --   --   --   --  0.6 0.9 0.5 0.6  ALBUMIN  --   --   --   --  2.7* 2.5* 2.7* 3.0*  MG 1.4*  --   --  1.9 1.8 1.9 1.5* 2.2  CRP  --   --   --   --  33.8* 19.0* 9.1*  --   PROCALCITON  --   --   --  25.23 18.66 7.50 3.61  --   LATICACIDVEN  --  3.4* 2.1*  --   --   --   --   --   TSH  --   --   --  1.058  --   --   --   --   BNP 221.7*  --   --  188.9* 686.2* 141.0* 123.2*  --     ------------------------------------------------------------------------------------------------------------------ No results for input(s): CHOL, HDL, LDLCALC, TRIG, CHOLHDL, LDLDIRECT in the last 72 hours.  No results found for: HGBA1C  No results for input(s): TSH, T4TOTAL, T3FREE, THYROIDAB in the last 72 hours.  Invalid input(s): FREET3  ------------------------------------------------------------------------------------------------------------------ ID Labs Recent Labs  Lab 04/26/21 1412 04/26/21 1555 04/26/21 1811 04/27/21 0109 04/28/21 0036 04/29/21 0149 04/30/21 0227 05/01/21 0040  WBC 18.4*  --   --  18.8* 13.0* 14.6* 16.0*  --   PLT 193  --   --  148* 126* 172  206  --   CRP  --   --   --   --  33.8* 19.0* 9.1*  --   PROCALCITON  --   --   --  25.23 18.66 7.50 3.61  --   LATICACIDVEN  --  3.4* 2.1*  --   --   --   --   --   CREATININE 1.06*  --   --  1.39* 1.43* 1.22* 1.28* 1.06*   Cardiac Enzymes No results for input(s): CKMB, TROPONINI, MYOGLOBIN in the last 168 hours.  Invalid input(s): CK   Radiology Reports DG Chest Port 1 View  Result Date: 04/28/2021 CLINICAL DATA:  Shortness of breath. EXAM: PORTABLE CHEST 1 VIEW COMPARISON:  04/27/2020 FINDINGS: 0633 hours. The cardio pericardial silhouette is enlarged. The lungs are clear without focal pneumonia, edema, pneumothorax or pleural effusion. Interstitial markings are diffusely coarsened with chronic features. Bones are diffusely demineralized. Telemetry leads overlie the chest. IMPRESSION: No acute cardiopulmonary findings. Electronically Signed   By: Verda Cumins.D.  On: 04/28/2021 07:33   ECHOCARDIOGRAM COMPLETE  Result Date: 04/27/2021    ECHOCARDIOGRAM REPORT   Patient Name:   Christie Sanders Date of Exam: 04/27/2021 Medical Rec #:  [id number]      Height:       60.0 in Accession #:    [id number]     Weight:       239.2 lb Date of Birth:  11-Feb-1946      BSA:          2.014 m Patient Age:    47 years       BP:           121/89 mmHg Patient Gender: F              HR:           99 bpm. Exam Location:  Inpatient Procedure: 2D Echo, Cardiac Doppler and Color Doppler Indications:    I48.91 AFIB  History:        Patient has no prior history of Echocardiogram examinations.                 Risk Factors:Hypertension and Dyslipidemia.  Sonographer:    Beryle Beams Referring Phys: [place]  1. Left ventricular ejection fraction, by estimation, is 50 to 55%. The left ventricle has low normal function. The left ventricle has no regional wall motion abnormalities. Left ventricular diastolic parameters are indeterminate.  2. Right ventricular systolic function is normal. The right ventricular  size is normal. There is normal pulmonary artery systolic pressure.  3. The mitral valve is degenerative. Mild mitral valve regurgitation. No evidence of mitral stenosis. Moderate mitral annular calcification.  4. The aortic valve is tricuspid. There is moderate calcification of the aortic valve. There is moderate thickening of the aortic valve. Aortic valve regurgitation is mild. Aortic valve sclerosis/calcification is present, without any evidence of aortic stenosis.  5. The inferior vena cava is normal in size with greater than 50% respiratory variability, suggesting right atrial pressure of 3 mmHg. FINDINGS  Left Ventricle: Left ventricular ejection fraction, by estimation, is 50 to 55%. The left ventricle has low normal function. The left ventricle has no regional wall motion abnormalities. The left ventricular internal cavity size was normal in size. There is no left ventricular hypertrophy. Left ventricular diastolic parameters are indeterminate. Right Ventricle: The right ventricular size is normal. No increase in right ventricular wall thickness. Right ventricular systolic function is normal. There is normal pulmonary artery systolic pressure. The tricuspid regurgitant velocity is 2.74 m/s, and  with an assumed right atrial pressure of 3 mmHg, the estimated right ventricular systolic pressure is 123456 mmHg. Left Atrium: Left atrial size was normal in size. Right Atrium: Right atrial size was normal in size. Pericardium: There is no evidence of pericardial effusion. Mitral Valve: The mitral valve is degenerative in appearance. There is moderate thickening of the mitral valve leaflet(s). There is moderate calcification of the mitral valve leaflet(s). Moderate mitral annular calcification. Mild mitral valve regurgitation. No evidence of mitral valve stenosis. MV peak gradient, 82.1 mmHg. The mean mitral valve gradient is 56.0 mmHg. Tricuspid Valve: The tricuspid valve is normal in structure. Tricuspid valve  regurgitation is mild . No evidence of tricuspid stenosis. Aortic Valve: The aortic valve is tricuspid. There is moderate calcification of the aortic valve. There is moderate thickening of the aortic valve. Aortic valve regurgitation is mild. Aortic regurgitation PHT measures 617 msec. Aortic valve sclerosis/calcification is present, without any evidence  of aortic stenosis. Aortic valve mean gradient measures 4.0 mmHg. Aortic valve peak gradient measures 8.0 mmHg. Aortic valve area, by VTI measures 2.36 cm. Pulmonic Valve: The pulmonic valve was normal in structure. Pulmonic valve regurgitation is not visualized. No evidence of pulmonic stenosis. Aorta: The aortic root is normal in size and structure. Venous: The inferior vena cava is normal in size with greater than 50% respiratory variability, suggesting right atrial pressure of 3 mmHg. IAS/Shunts: No atrial level shunt detected by color flow Doppler.  LEFT VENTRICLE PLAX 2D LVIDd:         4.20 cm     Diastology LVIDs:         2.40 cm     LV e' medial:  13.80 cm/s LV PW:         1.10 cm     LV e' lateral: 9.68 cm/s LV IVS:        1.00 cm LVOT diam:     2.10 cm LV SV:         45 LV SV Index:   22 LVOT Area:     3.46 cm  LV Volumes (MOD) LV vol d, MOD A2C: 65.3 ml LV vol d, MOD A4C: 54.7 ml LV vol s, MOD A2C: 24.3 ml LV vol s, MOD A4C: 19.5 ml LV SV MOD A2C:     41.0 ml LV SV MOD A4C:     54.7 ml LV SV MOD BP:      39.7 ml RIGHT VENTRICLE            IVC RV S prime:     9.90 cm/s  IVC diam: 2.10 cm TAPSE (M-mode): 1.9 cm LEFT ATRIUM             Index        RIGHT ATRIUM           Index LA diam:        3.60 cm 1.79 cm/m   RA Area:     15.10 cm LA Vol (A2C):   67.7 ml 33.61 ml/m  RA Volume:   32.50 ml  16.14 ml/m LA Vol (A4C):   43.2 ml 21.45 ml/m LA Biplane Vol: 57.2 ml 28.40 ml/m  AORTIC VALVE                    PULMONIC VALVE AV Area (Vmax):    2.34 cm     PV Vmax:       0.58 m/s AV Area (Vmean):   2.23 cm     PV Vmean:      32.800 cm/s AV Area (VTI):      2.36 cm     PV VTI:        0.079 m AV Vmax:           141.00 cm/s  PV Peak grad:  1.4 mmHg AV Vmean:          95.600 cm/s  PV Mean grad:  1.0 mmHg AV VTI:            0.191 m AV Peak Grad:      8.0 mmHg AV Mean Grad:      4.0 mmHg LVOT Vmax:         95.40 cm/s LVOT Vmean:        61.600 cm/s LVOT VTI:          0.130 m LVOT/AV VTI ratio: 0.68 AI PHT:  617 msec  AORTA Ao Root diam: 3.30 cm MITRAL VALVE              TRICUSPID VALVE MV Area VTI:  0.36 cm    TV Peak grad:   29.4 mmHg MV Peak grad: 82.1 mmHg   TV Mean grad:   21.0 mmHg MV Mean grad: 56.0 mmHg   TV Vmax:        2.71 m/s MV Vmax:      4.53 m/s    TV Vmean:       214.0 cm/s MV Vmean:     352.0 cm/s  TV VTI:         0.74 msec                           TR Peak grad:   30.0 mmHg                           TR Vmax:        274.00 cm/s                            SHUNTS                           Systemic VTI:  0.13 m                           Systemic Diam: 2.10 cm Jenkins Rouge MD Electronically signed by Jenkins Rouge MD Signature Date/Time: 04/27/2021/12:12:04 PM    Final

## 2021-05-01 NOTE — Progress Notes (Signed)
Physical Therapy Treatment Patient Details Name: Christie Sanders MRN: [id number] DOB: May 13, 1945 Today's Date: 05/01/2021   History of Present Illness 76 yo female with onset of a fall at home in which she struck her head was admitted on 2.2 with ongoing UTI and noted acute resp failure with A-fib and RVR due to sepsis.  Pt is weak and on O2, has worsened symptoms of HR and breathing struggles to contend with.  PMHx:  Alz dementia, HA's, fibromyalgia, HTN    PT Comments    Patient remains confused--thinking she is at home throughout session. Able to participate with LE exercises and dangle EOB, however pt too fearful to attempt standing. ?will try if 2 person assist attempted vs try use of stedy.    Recommendations for follow up therapy are one component of a multi-disciplinary discharge planning process, led by the attending physician.  Recommendations may be updated based on patient status, additional functional criteria and insurance authorization.  Follow Up Recommendations  Skilled nursing-short term rehab (<3 hours/day)     Assistance Recommended at Discharge Frequent or constant Supervision/Assistance  Patient can return home with the following Two people to help with walking and/or transfers;Two people to help with bathing/dressing/bathroom;Assistance with cooking/housework;Assist for transportation;Help with stairs or ramp for entrance   Equipment Recommendations  None recommended by PT    Recommendations for Other Services       Precautions / Restrictions Precautions Precautions: Fall Precaution Comments: monitor HR and O2 sats     Mobility  Bed Mobility Overal bed mobility: Needs Assistance Bed Mobility: Rolling, Sidelying to Sit, Sit to Supine Rolling: Supervision Sidelying to sit: Mod assist   Sit to supine: Mod assist   General bed mobility comments: HOB elevated and rail for up to side of bed, assisting to move LEs and pt assisting to raise torso; pt managed  torso as return to supine and assisted to raise legs onto bed    Transfers Overall transfer level: Needs assistance Equipment used: None              Lateral/Scoot Transfers: Min assist General transfer comment: aattempted sit to stand with RW in front of pt and she refused due to fear of falling; able to laterally scoot x 4 to her right towards [facility] prior to return to supine    Ambulation/Gait               General Gait Details: unable to try   Stairs             Wheelchair Mobility    Modified Rankin (Stroke Patients Only)       Balance Overall balance assessment: History of Falls, Needs assistance Sitting-balance support: Feet supported, No upper extremity supported Sitting balance-Leahy Scale: Fair                                      Cognition Arousal/Alertness: Awake/alert Behavior During Therapy: WFL for tasks assessed/performed Overall Cognitive Status: History of cognitive impairments - at baseline                                 General Comments: Patient oriented to self only,; repeatedly thought she was at home, asking for her purse and wallet        Exercises General Exercises - Lower Extremity Ankle Circles/Pumps: AROM, Both, 10 reps  Heel Slides: AROM, Both, 10 reps    General Comments        Pertinent Vitals/Pain Pain Assessment Pain Assessment: No/denies pain    Home Living                          Prior Function            PT Goals (current goals can now be found in the care plan section) Acute Rehab PT Goals Patient Stated Goal: to get home and feel better Time For Goal Achievement: 05/11/21 Potential to Achieve Goals: Fair Progress towards PT goals: Not progressing toward goals - comment (fear of standing; will need +2)    Frequency    Min 2X/week      PT Plan Current plan remains appropriate    Co-evaluation              AM-PAC PT "6 Clicks" Mobility    Outcome Measure  Help needed turning from your back to your side while in a flat bed without using bedrails?: A Little Help needed moving from lying on your back to sitting on the side of a flat bed without using bedrails?: A Lot Help needed moving to and from a bed to a chair (including a wheelchair)?: Total Help needed standing up from a chair using your arms (e.g., wheelchair or bedside chair)?: Total Help needed to walk in hospital room?: Total Help needed climbing 3-5 steps with a railing? : Total 6 Click Score: 9    End of Session Equipment Utilized During Treatment: Oxygen Activity Tolerance: Other (comment) (limited by fear of falling) Patient left: in bed;with call bell/phone within reach;with bed alarm set (in chair-like position) Nurse Communication: Mobility status PT Visit Diagnosis: Muscle weakness (generalized) (M62.81);Difficulty in walking, not elsewhere classified (R26.2);History of falling (Z91.81)     Time: 6834-1962 PT Time Calculation (min) (ACUTE ONLY): 23 min  Charges:  $Therapeutic Activity: 23-37 mins                      Jerolyn Center, PT Acute Rehabilitation Services  Pager [phone] Office [phone]    Zena Amos 05/01/2021, 9:52 AM

## 2021-05-01 NOTE — Care Management Important Message (Signed)
Important Message  Patient Details  Name: Christie Sanders MRN: [id number] Date of Birth: 1945/04/26   Medicare Important Message Given:        Orbie Pyo 05/01/2021, 9:07 AM

## 2021-05-02 LAB — MAGNESIUM: Magnesium: 1.5 mg/dL — ABNORMAL LOW (ref 1.7–2.4)

## 2021-05-02 LAB — COMPREHENSIVE METABOLIC PANEL
ALT: 35 U/L (ref 0–44)
AST: 18 U/L (ref 15–41)
Albumin: 2.4 g/dL — ABNORMAL LOW (ref 3.5–5.0)
Alkaline Phosphatase: 67 U/L (ref 38–126)
Anion gap: 12 (ref 5–15)
BUN: 22 mg/dL (ref 8–23)
CO2: 28 mmol/L (ref 22–32)
Calcium: 8.6 mg/dL — ABNORMAL LOW (ref 8.9–10.3)
Chloride: 91 mmol/L — ABNORMAL LOW (ref 98–111)
Creatinine, Ser: 0.87 mg/dL (ref 0.44–1.00)
GFR, Estimated: >60 mL/min (ref >60)
Glucose, Bld: 86 mg/dL (ref 70–99)
Potassium: 3.7 mmol/L (ref 3.5–5.1)
Sodium: 131 mmol/L — ABNORMAL LOW (ref 135–145)
Total Bilirubin: 0.4 mg/dL (ref 0.3–1.2)
Total Protein: 5.4 g/dL — ABNORMAL LOW (ref 6.5–8.1)

## 2021-05-02 LAB — CBC WITH DIFFERENTIAL/PLATELET
Abs Immature Granulocytes: 0.3 10*3/uL — ABNORMAL HIGH (ref 0.00–0.07)
Basophils Absolute: 0 10*3/uL (ref 0.0–0.1)
Basophils Relative: 0 %
Eosinophils Absolute: 0.7 10*3/uL — ABNORMAL HIGH (ref 0.0–0.5)
Eosinophils Relative: 4 %
HCT: 35.7 % — ABNORMAL LOW (ref 36.0–46.0)
Hemoglobin: 12 g/dL (ref 12.0–15.0)
Lymphocytes Relative: 11 %
Lymphs Abs: 1.8 10*3/uL (ref 0.7–4.0)
MCH: 31.8 pg (ref 26.0–34.0)
MCHC: 33.6 g/dL (ref 30.0–36.0)
MCV: 94.7 fL (ref 80.0–100.0)
Metamyelocytes Relative: 1 %
Monocytes Absolute: 1.2 10*3/uL — ABNORMAL HIGH (ref 0.1–1.0)
Monocytes Relative: 7 %
Myelocytes: 1 %
Neutro Abs: 12.6 10*3/uL — ABNORMAL HIGH (ref 1.7–7.7)
Neutrophils Relative %: 76 %
Platelets: 240 10*3/uL (ref 150–400)
RBC: 3.77 MIL/uL — ABNORMAL LOW (ref 3.87–5.11)
RDW: 13.2 % (ref 11.5–15.5)
WBC: 16.6 10*3/uL — ABNORMAL HIGH (ref 4.0–10.5)
nRBC: 0 % (ref 0.0–0.2)
nRBC: 0 /100 WBC

## 2021-05-02 LAB — CULTURE, BLOOD (ROUTINE X 2)
Special Requests: ADEQUATE
Special Requests: ADEQUATE

## 2021-05-02 LAB — BRAIN NATRIURETIC PEPTIDE: B Natriuretic Peptide: 156.8 pg/mL — ABNORMAL HIGH (ref 0.0–100.0)

## 2021-05-02 MED ORDER — SODIUM CHLORIDE 0.9% FLUSH: 10.0000 mL | INTRAVENOUS | Status: DC | PRN

## 2021-05-02 MED ORDER — MAGNESIUM SULFATE 2 GM/50ML IV SOLN
2.0000 g | Freq: Once | INTRAVENOUS | Status: AC
  Administered 2021-05-02: 2 g via INTRAVENOUS
  Filled 2021-05-02: qty 50

## 2021-05-02 MED ORDER — SODIUM CHLORIDE 0.9% FLUSH
10.0000 mL | Freq: Two times a day (BID) | INTRAVENOUS | Status: DC
  Administered 2021-05-02 – 2021-05-04 (×4): 10 mL

## 2021-05-02 NOTE — TOC Progression Note (Signed)
Transition of Care [facility]) - Progression Note    Patient Details  Name: GAIA TETRAULT MRN: [id number] Date of Birth: May 28, 1945  Transition of Care [facility]) CM/SW [place], LCSW Phone Number: 05/02/2021, 10:32 AM  Clinical Narrative:    Patient's daughter in law and son have selected Emergency planning/management officer ([place]). Facility to begin insurance authorization.    Expected Discharge Plan: Skilled Nursing Facility Barriers to Discharge: Continued Medical Work up, SNF Pending bed offer, Ship broker  Expected Discharge Plan and Services Expected Discharge Plan: [place] In-house Referral: Clinical Social Work                                             Social Determinants of Health (SDOH) Interventions    Readmission Risk Interventions No flowsheet data found.

## 2021-05-02 NOTE — Progress Notes (Signed)

## 2021-05-02 NOTE — Progress Notes (Signed)
Occupational Therapy Treatment Patient Details Name: Christie Sanders MRN: [id number] DOB: 1945/06/28 Today's Date: 05/02/2021   History of present illness 76 yo female with onset of a fall at home in which she struck her head was admitted on 2.2 with ongoing UTI and noted acute resp failure with A-fib and RVR due to sepsis.  Pt is weak and on O2, has worsened symptoms of HR and breathing struggles to contend with.  PMHx:  Alz dementia, HA's, fibromyalgia, HTN   OT comments  Pt making slow progress with functional goals. Pt in be upon arrival a agreeable to sit EOB for activity. Pt required mod A sup - sit with cues for sequencing; required increased time and effort. Pt participated in simulated bathing tasks, grooming and UB dressing. Pt pleasantly confused,  oriented to self only, stated that she was at [facility], then stating x 2 that she was at home. OT will continue to follow acutely to maximize level of function and safety     Recommendations for follow up therapy are one component of a multi-disciplinary discharge planning process, led by the attending physician.  Recommendations may be updated based on patient status, additional functional criteria and insurance authorization.    Follow Up Recommendations  Skilled nursing-short term rehab (<3 hours/day)    Assistance Recommended at Discharge Frequent or constant Supervision/Assistance  Patient can return home with the following  Two people to help with walking and/or transfers;Two people to help with bathing/dressing/bathroom;Assistance with cooking/housework;Direct supervision/assist for medications management;Direct supervision/assist for financial management;Assist for transportation   Equipment Recommendations   (TBD at SNF)    Recommendations for Other Services      Precautions / Restrictions Precautions Precautions: Fall Precaution Comments: monitor HR and O2 sats Restrictions Weight Bearing Restrictions: No        Mobility Bed Mobility Overal bed mobility: Needs Assistance Bed Mobility: Rolling, Sidelying to Sit, Sit to Sidelying Rolling: Min assist       Sit to sidelying: Mod assist General bed mobility comments: assist with LEs and elevating trunk to sit EOB, min A with LEs back onto bed    Transfers                   General transfer comment: pt declined to attempt standing, SPTs     Balance Overall balance assessment: History of Falls, Needs assistance Sitting-balance support: Feet supported, No upper extremity supported Sitting balance-Leahy Scale: Fair Sitting balance - Comments: mod - mn A for balance/support Postural control: Posterior lean                                 ADL either performed or assessed with clinical judgement   ADL Overall ADL's : Needs assistance/impaired     Grooming: Wash/dry hands;Wash/dry face;Brushing hair;Min guard;Sitting   Upper Body Bathing: Minimal assistance;Sitting Upper Body Bathing Details (indicate cue type and reason): smulated seated EOB Lower Body Bathing: Maximal assistance;Sitting/lateral leans Lower Body Bathing Details (indicate cue type and reason): simulated seated EOB Upper Body Dressing : Minimal assistance;Sitting                          Extremity/Trunk Assessment Upper Extremity Assessment Upper Extremity Assessment: Generalized weakness   Lower Extremity Assessment Lower Extremity Assessment: Defer to PT evaluation        Vision Baseline Vision/History: 1 Wears glasses Ability to See in Adequate Light:  0 Adequate Patient Visual Report: No change from baseline     Perception     Praxis      Cognition Arousal/Alertness: Awake/alert Behavior During Therapy: WFL for tasks assessed/performed Overall Cognitive Status: History of cognitive impairments - at baseline                                 General Comments: Patient oriented to self only, stated that she was  at [facility], then stating x 2 that she was at home        Exercises      Shoulder Instructions       General Comments      Pertinent Vitals/ Pain       Pain Assessment Pain Assessment: Faces Faces Pain Scale: Hurts little more Pain Location: general soreness in bed Pain Descriptors / Indicators: Guarding, Grimacing Pain Intervention(s): Monitored during session, Repositioned, Limited activity within patient's tolerance  Home Living                                          Prior Functioning/Environment              Frequency  Min 2X/week        Progress Toward Goals  OT Goals(current goals can now be found in the care plan section)  Progress towards OT goals: Progressing toward goals     Plan Discharge plan remains appropriate;Frequency remains appropriate    Co-evaluation                 AM-PAC OT "6 Clicks" Daily Activity     Outcome Measure   Help from another person eating meals?: None Help from another person taking care of personal grooming?: A Little Help from another person toileting, which includes using toliet, bedpan, or urinal?: Total Help from another person bathing (including washing, rinsing, drying)?: A Lot Help from another person to put on and taking off regular upper body clothing?: A Little Help from another person to put on and taking off regular lower body clothing?: Total 6 Click Score: 14    End of Session    OT Visit Diagnosis: Unsteadiness on feet (R26.81);Other abnormalities of gait and mobility (R26.89);Muscle weakness (generalized) (M62.81);Other symptoms and signs involving cognitive function   Activity Tolerance Patient limited by fatigue   Patient Left in bed;with call bell/phone within reach;with bed alarm set   Nurse Communication          Time: [id number] OT Time Calculation (min): 21 min  Charges: OT General Charges $OT Visit: 1 Visit   Britt Bottom 05/02/2021, 1:33  PM

## 2021-05-02 NOTE — Progress Notes (Signed)
RT note. Patient not requiring bipap at this time, patient currently on 3L [place] sat 97%, no labored breathing noted. RT will continue to monitor.

## 2021-05-02 NOTE — Progress Notes (Signed)
Pt has PRN BIPAP orders, no distress noted at this time.  °

## 2021-05-02 NOTE — Progress Notes (Signed)
PROGRESS NOTE                                                                                                                                                                                                             Patient Demographics:    Christie Sanders, is a 76 y.o. female, DOB - 1946/01/23, [id number]  Outpatient Primary MD for the patient is Nodal, Alphonzo Dublin, PA-C    LOS - 6  Admit date - 04/26/2021    Chief Complaint  Patient presents with   Altered Mental Status       Brief Narrative (HPI from H&P)  - 76 year old African-American female with reported history of falling 1 day before admission while going to the kitchen.  Reportedly had hit her head.  Also has had dysuria for about 3 weeks.  I talked to her son [name] on 04/27/2021 in detail, according to him she was having dysuria where her urine was burning and had a foul smell for the last few weeks, the last 3 to 4 days prior to hospital admission she was not feeling well and weak, she fell 2 days prior to hospital admission, family was trying to treat her the best at home but since she continued to get weaker they brought her to the ER.  Son also adds that she has been having some generalized headache off and on for the last 4 to 6 weeks, she was also diagnosed with dementia several years ago has been pretty mild thus far.  In the ER she was diagnosed with sepsis due to UTI, new onset A. fib RVR and she was admitted to the hospital.   Subjective:   Patient in bed, comfortable, no apparent distress, no fever, no chills, report right arm swelling .   Assessment  & Plan :    Sepsis with AKI, dehydration caused by E. Coli UTI . She is getting IV fluids, sepsis pathophysiology has resolved, she has been adequately hydrated, E. coli is pansensitive continue IV Rocephin x 7 doses.  Continue PT OT , will need SNF.  Blood cultures surprisingly negative despite very high  procalcitonin.  2.  AKI with Hyponatremia.  Due to acute on chronic diastolic CHF.  EF 55%.  Improving with diuresis, dosing Lasix daily.  3.  Newly diagnosed A. fib RVR.  Could be paroxysmal or  stress-induced.  Mali vas 2 score is be greater than 3.  She was initially on Cardizem and heparin drip now transition to oral Cardizem and Eliquis.  Stable echocardiogram and TSH.  4.  Alzheimer's dementia.  Per son has been getting increasingly more confused over the last month or so, supportive care, will remain at risk for delirium in the hospital, minimize narcotics and benzodiazepine.  Head CT is unremarkable no focal deficits for now.  Resumed Aricept and Namenda, per son she has not been taking it for a while.  Remains at risk for metabolic encephalopathy/delirium in the hospital in unfamiliar settings.  5.  Acute hypoxic respiratory failure in the ER.  Required BiPAP ?  Due to acute on chronic diastolic CHF EF [id number].  Improved with diuresis.  6.  Dyslipidemia.  Resumed home dose statin.  7.  GERD.  PPI.  8.  Hypomagnesemia.  Repleted  Patient with infiltrated IV in the right arm, discussed with staff, IV site  has been changed to other arm, and will elevate right arm welling.      Condition - Extremely Guarded  Family Communication  :  son [name] [phone] 04/27/21 - DNR, 04/28/21, 04/29/21, 04/30/2021  Code Status :  DNR  Consults  : None  PUD Prophylaxis :     Procedures  :     TTE - 1. Left ventricular ejection fraction, by estimation, is 50 to 55%. The left ventricle has low normal function. The left ventricle has no regional wall motion abnormalities. Left ventricular diastolic parameters are indeterminate.  2. Right ventricular systolic function is normal. The right ventricular size is normal. There is normal pulmonary artery systolic pressure.  3. The mitral valve is degenerative. Mild mitral valve regurgitation. No evidence of mitral stenosis. Moderate mitral annular calcification.   4. The aortic valve is tricuspid. There is moderate calcification of the aortic valve. There is moderate thickening of the aortic valve. Aortic valve regurgitation is mild. Aortic valve sclerosis/calcification is present, without any evidence of aortic stenosis.  5. The inferior vena cava is normal in size with greater than 50% respiratory variability, suggesting right atrial pressure of 3 mmHg.   CT Head - Non Acute      Disposition Plan  :    Status is: Inpatient  DVT Prophylaxis  :    SCDs Start: 04/27/21 0049 SCDs Start: 04/27/21 0049 apixaban (ELIQUIS) tablet 5 mg    Lab Results  Component Value Date   PLT 240 05/02/2021    Diet :  Diet Order             Diet regular Room service appropriate? Yes with Assist; Fluid consistency: Thin; Fluid restriction: 1200 mL Fluid  Diet effective now                    Inpatient Medications  Scheduled Meds:  amLODipine  5 mg Oral Daily   apixaban  5 mg Oral BID   carvedilol  6.25 mg Oral BID WC   cyclobenzaprine  5 mg Oral QHS   donepezil  10 mg Oral QHS   memantine  10 mg Oral BID   ondansetron (ZOFRAN) IV  4 mg Intravenous Once   pantoprazole  40 mg Oral Daily   pravastatin  40 mg Oral q1800   sodium chloride flush  10-40 mL Intracatheter Q12H   venlafaxine  75 mg Oral TID WC   Continuous Infusions:  cefTRIAXone (ROCEPHIN)  IV 2 g (05/02/21 1137)  PRN Meds:.acetaminophen, diltiazem, levalbuterol, sodium chloride flush  Antibiotics  :    Anti-infectives (From admission, onward)    Start     Dose/Rate Route Frequency Ordered Stop   04/27/21 2000  vancomycin (VANCOREADY) IVPB 1250 mg/250 mL  Status:  Discontinued        1,250 mg 166.7 mL/hr over 90 Minutes Intravenous Every 24 hours 04/26/21 1742 04/27/21 0048   04/27/21 0800  cefTRIAXone (ROCEPHIN) 2 g in sodium chloride 0.9 % 100 mL IVPB        2 g 200 mL/hr over 30 Minutes Intravenous Every 24 hours 04/27/21 0048     04/26/21 1930  vancomycin (VANCOCIN) IVPB  1000 mg/200 mL premix        1,000 mg 200 mL/hr over 60 Minutes Intravenous  Once 04/26/21 1742 04/26/21 2144   04/26/21 1830  vancomycin (VANCOCIN) IVPB 1000 mg/200 mL premix        1,000 mg 200 mL/hr over 60 Minutes Intravenous  Once 04/26/21 1742 04/26/21 1905   04/26/21 1800  ceFEPIme (MAXIPIME) 2 g in sodium chloride 0.9 % 100 mL IVPB  Status:  Discontinued        2 g 200 mL/hr over 30 Minutes Intravenous Every 12 hours 04/26/21 1742 04/27/21 0048   04/26/21 1500  cefTRIAXone (ROCEPHIN) 1 g in sodium chloride 0.9 % 100 mL IVPB        1 g 200 mL/hr over 30 Minutes Intravenous  Once 04/26/21 1458 04/26/21 1947         Gizella Belleville M.D on 05/02/2021 at 2:32 PM  To page go to [email or web address]   [facility] -  Office  [phone]  See all Orders from today for further details    Objective:   Vitals:   05/02/21 0451 05/02/21 0500 05/02/21 0820 05/02/21 1213  BP: 126/81  97/84 125/82  Pulse: 90  (!) 107 91  Resp: 18  18 18   Temp: 98 F (36.7 C)  98.5 F (36.9 C) 99.1 F (37.3 C)  TempSrc: Oral  Oral Oral  SpO2:      Weight:  88.5 kg    Height:        Wt Readings from Last 3 Encounters:  05/02/21 88.5 kg  09/07/13 108.5 kg     Intake/Output Summary (Last 24 hours) at 05/02/2021 1432 Last data filed at 05/02/2021 0149 Gross per 24 hour  Intake --  Output 400 ml  Net -400 ml     Physical Exam  Awake Alert, pleasantly confused, in no apparent distress  Symmetrical Chest wall movement, Good air movement bilaterally, CTAB RRR,No Gallops,Rubs or new Murmurs, No Parasternal Heave +ve B.Sounds, Abd Soft, No tenderness, No rebound - guarding or rigidity. No Cyanosis, Clubbing or edema, No new Rash or bruise      Data Review:    CBC Recent Labs  Lab 04/27/21 0109 04/28/21 0036 04/29/21 0149 04/30/21 0227 05/02/21 0035  WBC 18.8* 13.0* 14.6* 16.0* 16.6*  HGB 12.5 12.6 12.5 13.2 12.0  HCT 36.0 36.7 35.1* 37.4 35.7*  PLT 148* 126* 172 206 240  MCV  94.0 92.4 91.6 92.3 94.7  MCH 32.6 31.7 32.6 32.6 31.8  MCHC 34.7 34.3 35.6 35.3 33.6  RDW 13.2 13.2 13.0 13.2 13.2  LYMPHSABS 0.4* 0.6* 0.9 1.7 1.8  MONOABS 0.2 0.8 1.5* 2.4* 1.2*  EOSABS 0.0 0.4 0.0 0.0 0.7*  BASOSABS 0.0 0.0 0.0 0.0 0.0    Electrolytes Recent Labs  Lab 04/26/21 1555 04/26/21 1811 04/27/21 0109  04/28/21 0036 04/29/21 0149 04/30/21 0227 05/01/21 0040 05/02/21 0035  NA  --   --  126* 129* 129* 133* 134* 131*  K  --   --  3.4* 2.9* 4.3 3.2* 3.9 3.7  CL  --   --  94* 93* 94* 95* 95* 91*  CO2  --   --  20* 23 25 26 30 28   GLUCOSE  --   --  158* 141* 116* 90 89 86  BUN  --   --  26* 32* 37* 37* 33* 22  CREATININE  --   --  1.39* 1.43* 1.22* 1.28* 1.06* 0.87  CALCIUM  --   --  8.7* 8.6* 8.8* 8.9 8.9 8.6*  AST  --   --   --  36 63* 59* 24 18  ALT  --   --   --  29 52* 71* 50* 35  ALKPHOS  --   --   --  86 80 68 72 67  BILITOT  --   --   --  0.6 0.9 0.5 0.6 0.4  ALBUMIN  --   --   --  2.7* 2.5* 2.7* 3.0* 2.4*  MG  --   --  1.9 1.8 1.9 1.5* 2.2 1.5*  CRP  --   --   --  33.8* 19.0* 9.1*  --   --   PROCALCITON  --   --  25.23 18.66 7.50 3.61  --   --   LATICACIDVEN 3.4* 2.1*  --   --   --   --   --   --   TSH  --   --  1.058  --   --   --   --   --   BNP  --   --  188.9* 686.2* 141.0* 123.2*  --  156.8*    ------------------------------------------------------------------------------------------------------------------ No results for input(s): CHOL, HDL, LDLCALC, TRIG, CHOLHDL, LDLDIRECT in the last 72 hours.  No results found for: HGBA1C  No results for input(s): TSH, T4TOTAL, T3FREE, THYROIDAB in the last 72 hours.  Invalid input(s): FREET3  ------------------------------------------------------------------------------------------------------------------ ID Labs Recent Labs  Lab 04/26/21 1555 04/26/21 1811 04/27/21 0109 04/28/21 0036 04/29/21 0149 04/30/21 0227 05/01/21 0040 05/02/21 0035  WBC  --   --  18.8* 13.0* 14.6* 16.0*  --  16.6*  PLT   --   --  148* 126* 172 206  --  240  CRP  --   --   --  33.8* 19.0* 9.1*  --   --   PROCALCITON  --   --  25.23 18.66 7.50 3.61  --   --   LATICACIDVEN 3.4* 2.1*  --   --   --   --   --   --   CREATININE  --   --  1.39* 1.43* 1.22* 1.28* 1.06* 0.87   Cardiac Enzymes No results for input(s): CKMB, TROPONINI, MYOGLOBIN in the last 168 hours.  Invalid input(s): CK   Radiology Reports No results found.

## 2021-05-03 LAB — COMPREHENSIVE METABOLIC PANEL
ALT: 40 U/L (ref 0–44)
AST: 32 U/L (ref 15–41)
Albumin: 2.1 g/dL — ABNORMAL LOW (ref 3.5–5.0)
Alkaline Phosphatase: 61 U/L (ref 38–126)
Anion gap: 9 (ref 5–15)
BUN: 22 mg/dL (ref 8–23)
CO2: 30 mmol/L (ref 22–32)
Calcium: 8.6 mg/dL — ABNORMAL LOW (ref 8.9–10.3)
Chloride: 93 mmol/L — ABNORMAL LOW (ref 98–111)
Creatinine, Ser: 0.96 mg/dL (ref 0.44–1.00)
GFR, Estimated: >60 mL/min (ref >60)
Glucose, Bld: 103 mg/dL — ABNORMAL HIGH (ref 70–99)
Potassium: 3.9 mmol/L (ref 3.5–5.1)
Sodium: 132 mmol/L — ABNORMAL LOW (ref 135–145)
Total Bilirubin: 0.3 mg/dL (ref 0.3–1.2)
Total Protein: 4.8 g/dL — ABNORMAL LOW (ref 6.5–8.1)

## 2021-05-03 LAB — CBC WITH DIFFERENTIAL/PLATELET
Abs Immature Granulocytes: 0 10*3/uL (ref 0.00–0.07)
Basophils Absolute: 0 10*3/uL (ref 0.0–0.1)
Basophils Relative: 0 %
Eosinophils Absolute: 0.2 10*3/uL (ref 0.0–0.5)
Eosinophils Relative: 1 %
HCT: 33.3 % — ABNORMAL LOW (ref 36.0–46.0)
Hemoglobin: 11.5 g/dL — ABNORMAL LOW (ref 12.0–15.0)
Lymphocytes Relative: 16 %
Lymphs Abs: 2.5 10*3/uL (ref 0.7–4.0)
MCH: 33 pg (ref 26.0–34.0)
MCHC: 34.5 g/dL (ref 30.0–36.0)
MCV: 95.7 fL (ref 80.0–100.0)
Monocytes Absolute: 0.2 10*3/uL (ref 0.1–1.0)
Monocytes Relative: 1 %
Neutro Abs: 12.7 10*3/uL — ABNORMAL HIGH (ref 1.7–7.7)
Neutrophils Relative %: 82 %
Platelets: 327 10*3/uL (ref 150–400)
RBC: 3.48 MIL/uL — ABNORMAL LOW (ref 3.87–5.11)
RDW: 13.3 % (ref 11.5–15.5)
WBC: 15.5 10*3/uL — ABNORMAL HIGH (ref 4.0–10.5)
nRBC: 0 % (ref 0.0–0.2)
nRBC: 0 /100 WBC

## 2021-05-03 LAB — MAGNESIUM: Magnesium: 1.5 mg/dL — ABNORMAL LOW (ref 1.7–2.4)

## 2021-05-03 LAB — BRAIN NATRIURETIC PEPTIDE: B Natriuretic Peptide: 131.6 pg/mL — ABNORMAL HIGH (ref 0.0–100.0)

## 2021-05-03 MED ORDER — ACYCLOVIR 5 % EX OINT
TOPICAL_OINTMENT | CUTANEOUS | Status: DC
  Filled 2021-05-03: qty 5
  Filled 2021-05-03: qty 15

## 2021-05-03 NOTE — Plan of Care (Signed)

## 2021-05-03 NOTE — Progress Notes (Signed)
°   05/03/21 1345  Clinical Encounter Type  Visited With Health care provider;Patient  Visit Type Spiritual support;Initial  Referral From Nurse  Consult/Referral To Chaplain  Spiritual Encounters  Spiritual Needs Emotional;Prayer  Stress Factors  Patient Stress Factors Health changes   Met with Christie Sanders per request for spiritual support and Prayer. Christie Sanders shared her faith heritage and Christie Sanders beliefs. She spoke of her daily Prayer ritual. She spoke of her siblings that died at early age and about her one living younger sister that she spoke on the phone with earlier today. She talked of her son that visits her semi-regularly and wishes that she saw him more frequently. She also spoke of her daughter that died from cancer four years ago. At patient's request we prayed for her healing and a renewed sense of purpose for her life. Chaplain [employer], M.Min..

## 2021-05-03 NOTE — TOC Progression Note (Signed)
Transition of Care [facility]) - Progression Note    Patient Details  Name: Christie Sanders MRN: [id number] Date of Birth: [birth year]  Transition of Care [facility]) CM/SW Contact  Mearl Latin, LCSW Phone Number: 05/03/2021, 9:01 AM  Clinical Narrative:    [facility] still awaiting insurance authorization.    Expected Discharge Plan: Skilled Nursing Facility Barriers to Discharge: Continued Medical Work up, SNF Pending bed offer, English as a second language teacher  Expected Discharge Plan and Services Expected Discharge Plan: Skilled Nursing Facility In-house Referral: Clinical Social Work                                             Social Determinants of Health (SDOH) Interventions    Readmission Risk Interventions No flowsheet data found.

## 2021-05-03 NOTE — Progress Notes (Addendum)
PROGRESS NOTE                                                                                                                                                                                                             Patient Demographics:    Christie Sanders, is a 76 y.o. female, DOB - Dec 16, 1945, [id number]  Outpatient Primary MD for the patient is Nodal, Alphonzo Dublin, PA-C    LOS - 7  Admit date - 04/26/2021    Chief Complaint  Patient presents with   Altered Mental Status       Brief Narrative (HPI from H&P)  -   76 year old African-American female with reported history of falling 1 day before admission while going to the kitchen.  Reportedly had hit her head.  Also has had dysuria for about 3 weeks.  I talked to her son [name] on 04/27/2021 in detail, according to him she was having dysuria where her urine was burning and had a foul smell for the last few weeks, the last 3 to 4 days prior to hospital admission she was not feeling well and weak, she fell 2 days prior to hospital admission, family was trying to treat her the best at home but since she continued to get weaker they brought her to the ER.  Son also adds that she has been having some generalized headache off and on for the last 4 to 6 weeks, she was also diagnosed with dementia several years ago has been pretty mild thus far.  In the ER she was diagnosed with sepsis due to UTI, new onset A. fib RVR and she was admitted to the hospital.   Subjective:   Patient in bed, comfortable, no apparent distress, no fever, no chills, sheno right arm foot pain. Denies right arm pain.   Assessment  & Plan :    1.Sepsis with AKI, dehydration caused by E. Coli UTI . She is getting IV fluids, sepsis pathophysiology has resolved, she has been adequately hydrated, E. coli is pansensitive , IV Rocephin x7 days.  Continue PT OT , will need SNF.  Blood cultures surprisingly negative  despite very high procalcitonin.  2.  AKI with Hyponatremia.  Due to acute on chronic diastolic CHF.  EF 55%.  Improving with diuresis, dosing Lasix daily.will give 20 mg of po lasix today.  3.  Newly diagnosed A. fib RVR.  Could be paroxysmal or stress-induced.  Mali vas 2 score is be greater than 3.  She was initially on Cardizem and heparin drip now transition to oral Cardizem and Eliquis.  Stable echocardiogram and TSH.  4.  Alzheimer's dementia.  Per son has been getting increasingly more confused over the last month or so, supportive care, will remain at risk for delirium in the hospital, minimize narcotics and benzodiazepine.  Head CT is unremarkable no focal deficits for now.  Resumed Aricept and Namenda, per son she has not been taking it for a while.  Remains at risk for metabolic encephalopathy/delirium in the hospital in unfamiliar settings.  5.  Acute hypoxic respiratory failure in the ER.  Required BiPAP ?  Due to acute on chronic diastolic CHF EF [id number].  Improved with diuresis.  6.  Dyslipidemia.  Resumed home dose statin.  7.  GERD.  PPI.  8.  Hypomagnesemia.  Repleted  Patient  with cold sore in her left lip, will start on acyclovir ointment  Patient with infiltrated IV in the right arm, discussed with staff, IV site  has been changed to other arm, and will elevate right arm welling.      Condition - Extremely Guarded  Family Communication  :  None at bedside awake Alert, Oriented X 3, No new F.N deficits, Normal affect Symmetrical Chest wall movement, Good air movement bilaterally, CTAB RRR,No Gallops,Rubs or new Murmurs, No Parasternal Heave +ve B.Sounds, Abd Soft, No tenderness, No rebound - guarding or rigidity. No Cyanosis, Clubbing or edema, No new Rash or bruise     Code Status :  DNR  Consults  : None  PUD Prophylaxis :     Procedures  :     TTE - 1. Left ventricular ejection fraction, by estimation, is 50 to 55%. The left ventricle has low normal  function. The left ventricle has no regional wall motion abnormalities. Left ventricular diastolic parameters are indeterminate.  2. Right ventricular systolic function is normal. The right ventricular size is normal. There is normal pulmonary artery systolic pressure.  3. The mitral valve is degenerative. Mild mitral valve regurgitation. No evidence of mitral stenosis. Moderate mitral annular calcification.  4. The aortic valve is tricuspid. There is moderate calcification of the aortic valve. There is moderate thickening of the aortic valve. Aortic valve regurgitation is mild. Aortic valve sclerosis/calcification is present, without any evidence of aortic stenosis.  5. The inferior vena cava is normal in size with greater than 50% respiratory variability, suggesting right atrial pressure of 3 mmHg.   CT Head - Non Acute      Disposition Plan  : Patient is medically stable for discharge once insurance authorization has been obtained.  Status is: Inpatient  DVT Prophylaxis  :    SCDs Start: 04/27/21 0049 SCDs Start: 04/27/21 0049 apixaban (ELIQUIS) tablet 5 mg    Lab Results  Component Value Date   PLT 327 05/03/2021    Diet :  Diet Order             Diet regular Room service appropriate? Yes with Assist; Fluid consistency: Thin; Fluid restriction: 1200 mL Fluid  Diet effective now                    Inpatient Medications  Scheduled Meds:  amLODipine  5 mg Oral Daily   apixaban  5 mg Oral BID   carvedilol  6.25 mg Oral BID WC  cyclobenzaprine  5 mg Oral QHS   donepezil  10 mg Oral QHS   memantine  10 mg Oral BID   ondansetron (ZOFRAN) IV  4 mg Intravenous Once   pantoprazole  40 mg Oral Daily   pravastatin  40 mg Oral q1800   sodium chloride flush  10-40 mL Intracatheter Q12H   venlafaxine  75 mg Oral TID WC   Continuous Infusions:   PRN Meds:.acetaminophen, diltiazem, levalbuterol, sodium chloride flush  Antibiotics  :    Anti-infectives (From admission,  onward)    Start     Dose/Rate Route Frequency Ordered Stop   04/27/21 2000  vancomycin (VANCOREADY) IVPB 1250 mg/250 mL  Status:  Discontinued        1,250 mg 166.7 mL/hr over 90 Minutes Intravenous Every 24 hours 04/26/21 1742 04/27/21 0048   04/27/21 0800  cefTRIAXone (ROCEPHIN) 2 g in sodium chloride 0.9 % 100 mL IVPB  Status:  Discontinued        2 g 200 mL/hr over 30 Minutes Intravenous Every 24 hours 04/27/21 0048 05/02/21 1511   04/26/21 1930  vancomycin (VANCOCIN) IVPB 1000 mg/200 mL premix        1,000 mg 200 mL/hr over 60 Minutes Intravenous  Once 04/26/21 1742 04/26/21 2144   04/26/21 1830  vancomycin (VANCOCIN) IVPB 1000 mg/200 mL premix        1,000 mg 200 mL/hr over 60 Minutes Intravenous  Once 04/26/21 1742 04/26/21 1905   04/26/21 1800  ceFEPIme (MAXIPIME) 2 g in sodium chloride 0.9 % 100 mL IVPB  Status:  Discontinued        2 g 200 mL/hr over 30 Minutes Intravenous Every 12 hours 04/26/21 1742 04/27/21 0048   04/26/21 1500  cefTRIAXone (ROCEPHIN) 1 g in sodium chloride 0.9 % 100 mL IVPB        1 g 200 mL/hr over 30 Minutes Intravenous  Once 04/26/21 1458 04/26/21 1947         Alioune Hodgkin M.D on 05/03/2021 at 1:49 PM  To page go to [email or web address]   [facility] -  Office  [phone]  See all Orders from today for further details    Objective:   Vitals:   05/03/21 0400 05/03/21 0500 05/03/21 0814 05/03/21 1147  BP: (!) 125/91  129/89 113/79  Pulse: 86 84 91 89  Resp: (!) 21  18 18   Temp: 98.2 F (36.8 C)  98.1 F (36.7 C) 97.8 F (36.6 C)  TempSrc: Oral  Oral Oral  SpO2: 100%  94% 99%  Weight:  90 kg    Height:        Wt Readings from Last 3 Encounters:  05/03/21 90 kg  09/07/13 108.5 kg     Intake/Output Summary (Last 24 hours) at 05/03/2021 1349 Last data filed at 05/03/2021 0630 Gross per 24 hour  Intake --  Output 650 ml  Net -650 ml     Physical Exam  Awake Alert, pleasantly confused, in no apparent distress   Symmetrical Chest wall movement, Good air movement bilaterally, CTAB RRR,No Gallops,Rubs or new Murmurs, No Parasternal Heave +ve B.Sounds, Abd Soft, No tenderness, No rebound - guarding or rigidity. No Cyanosis, Clubbing or edema, No new Rash or bruise       Data Review:    CBC Recent Labs  Lab 04/28/21 0036 04/29/21 0149 04/30/21 0227 05/02/21 0035 05/03/21 0200  WBC 13.0* 14.6* 16.0* 16.6* 15.5*  HGB 12.6 12.5 13.2 12.0 11.5*  HCT 36.7 35.1* 37.4  35.7* 33.3*  PLT 126* 172 206 240 327  MCV 92.4 91.6 92.3 94.7 95.7  MCH 31.7 32.6 32.6 31.8 33.0  MCHC 34.3 35.6 35.3 33.6 34.5  RDW 13.2 13.0 13.2 13.2 13.3  LYMPHSABS 0.6* 0.9 1.7 1.8 2.5  MONOABS 0.8 1.5* 2.4* 1.2* 0.2  EOSABS 0.4 0.0 0.0 0.7* 0.2  BASOSABS 0.0 0.0 0.0 0.0 0.0    Electrolytes Recent Labs  Lab 04/26/21 1555 04/26/21 1811 04/27/21 0109 04/27/21 0109 04/28/21 0036 04/29/21 0149 04/30/21 0227 05/01/21 0040 05/02/21 0035 05/03/21 0200  NA  --   --  126*  --  129* 129* 133* 134* 131* 132*  K  --   --  3.4*  --  2.9* 4.3 3.2* 3.9 3.7 3.9  CL  --   --  94*  --  93* 94* 95* 95* 91* 93*  CO2  --   --  20*  --  23 25 26 30 28 30   GLUCOSE  --   --  158*  --  141* 116* 90 89 86 103*  BUN  --   --  26*  --  32* 37* 37* 33* 22 22  CREATININE  --   --  1.39*  --  1.43* 1.22* 1.28* 1.06* 0.87 0.96  CALCIUM  --   --  8.7*  --  8.6* 8.8* 8.9 8.9 8.6* 8.6*  AST  --   --   --    < > 36 63* 59* 24 18 32  ALT  --   --   --    < > 29 52* 71* 50* 35 40  ALKPHOS  --   --   --    < > 86 80 68 72 67 61  BILITOT  --   --   --    < > 0.6 0.9 0.5 0.6 0.4 0.3  ALBUMIN  --   --   --    < > 2.7* 2.5* 2.7* 3.0* 2.4* 2.1*  MG  --   --  1.9  --  1.8 1.9 1.5* 2.2 1.5* 1.5*  CRP  --   --   --   --  33.8* 19.0* 9.1*  --   --   --   PROCALCITON  --   --  25.23  --  18.66 7.50 3.61  --   --   --   LATICACIDVEN 3.4* 2.1*  --   --   --   --   --   --   --   --   TSH  --   --  1.058  --   --   --   --   --   --   --   BNP  --   --   188.9*  --  686.2* 141.0* 123.2*  --  156.8* 131.6*   < > = values in this interval not displayed.    ------------------------------------------------------------------------------------------------------------------ No results for input(s): CHOL, HDL, LDLCALC, TRIG, CHOLHDL, LDLDIRECT in the last 72 hours.  No results found for: HGBA1C  No results for input(s): TSH, T4TOTAL, T3FREE, THYROIDAB in the last 72 hours.  Invalid input(s): FREET3  ------------------------------------------------------------------------------------------------------------------ ID Labs Recent Labs  Lab 04/26/21 1555 04/26/21 1811 04/27/21 0109 04/28/21 0036 04/29/21 0149 04/30/21 0227 05/01/21 0040 05/02/21 0035 05/03/21 0200  WBC  --   --  18.8* 13.0* 14.6* 16.0*  --  16.6* 15.5*  PLT  --   --  148* 126* 172 206  --  240 327  CRP  --   --   --  33.8* 19.0* 9.1*  --   --   --   PROCALCITON  --   --  25.23 18.66 7.50 3.61  --   --   --   LATICACIDVEN 3.4* 2.1*  --   --   --   --   --   --   --   CREATININE  --   --  1.39* 1.43* 1.22* 1.28* 1.06* 0.87 0.96   Cardiac Enzymes No results for input(s): CKMB, TROPONINI, MYOGLOBIN in the last 168 hours.  Invalid input(s): CK   Radiology Reports No results found.

## 2021-05-04 LAB — CBC WITH DIFFERENTIAL/PLATELET
Abs Immature Granulocytes: 0 10*3/uL (ref 0.00–0.07)
Basophils Absolute: 0 10*3/uL (ref 0.0–0.1)
Basophils Relative: 0 %
Eosinophils Absolute: 0.7 10*3/uL — ABNORMAL HIGH (ref 0.0–0.5)
Eosinophils Relative: 4 %
HCT: 34.8 % — ABNORMAL LOW (ref 36.0–46.0)
Hemoglobin: 11.8 g/dL — ABNORMAL LOW (ref 12.0–15.0)
Lymphocytes Relative: 10 %
Lymphs Abs: 1.6 10*3/uL (ref 0.7–4.0)
MCH: 32.4 pg (ref 26.0–34.0)
MCHC: 33.9 g/dL (ref 30.0–36.0)
MCV: 95.6 fL (ref 80.0–100.0)
Monocytes Absolute: 0.2 10*3/uL (ref 0.1–1.0)
Monocytes Relative: 1 %
Neutro Abs: 13.9 10*3/uL — ABNORMAL HIGH (ref 1.7–7.7)
Neutrophils Relative %: 85 %
Platelets: 398 10*3/uL (ref 150–400)
RBC: 3.64 MIL/uL — ABNORMAL LOW (ref 3.87–5.11)
RDW: 13.2 % (ref 11.5–15.5)
WBC: 16.3 10*3/uL — ABNORMAL HIGH (ref 4.0–10.5)
nRBC: 0 % (ref 0.0–0.2)
nRBC: 0 /100 WBC

## 2021-05-04 LAB — BRAIN NATRIURETIC PEPTIDE: B Natriuretic Peptide: 114.5 pg/mL — ABNORMAL HIGH (ref 0.0–100.0)

## 2021-05-04 LAB — COMPREHENSIVE METABOLIC PANEL
ALT: 51 U/L — ABNORMAL HIGH (ref 0–44)
AST: 41 U/L (ref 15–41)
Albumin: 2.2 g/dL — ABNORMAL LOW (ref 3.5–5.0)
Alkaline Phosphatase: 71 U/L (ref 38–126)
Anion gap: 8 (ref 5–15)
BUN: 15 mg/dL (ref 8–23)
CO2: 28 mmol/L (ref 22–32)
Calcium: 8.7 mg/dL — ABNORMAL LOW (ref 8.9–10.3)
Chloride: 97 mmol/L — ABNORMAL LOW (ref 98–111)
Creatinine, Ser: 0.74 mg/dL (ref 0.44–1.00)
GFR, Estimated: >60 mL/min (ref >60)
Glucose, Bld: 89 mg/dL (ref 70–99)
Potassium: 3.9 mmol/L (ref 3.5–5.1)
Sodium: 133 mmol/L — ABNORMAL LOW (ref 135–145)
Total Bilirubin: 0.4 mg/dL (ref 0.3–1.2)
Total Protein: 5.1 g/dL — ABNORMAL LOW (ref 6.5–8.1)

## 2021-05-04 LAB — RESP PANEL BY RT-PCR (FLU A&B, COVID) ARPGX2
Influenza A by PCR: NEGATIVE
Influenza B by PCR: NEGATIVE
SARS Coronavirus 2 by RT PCR: NEGATIVE

## 2021-05-04 LAB — MAGNESIUM: Magnesium: 1.5 mg/dL — ABNORMAL LOW (ref 1.7–2.4)

## 2021-05-04 MED ORDER — APIXABAN 5 MG PO TABS: 5.0000 mg | ORAL_TABLET | Freq: Two times a day (BID) | ORAL | Status: DC

## 2021-05-04 MED ORDER — CARVEDILOL 6.25 MG PO TABS: 6.2500 mg | ORAL_TABLET | Freq: Two times a day (BID) | ORAL | Status: DC

## 2021-05-04 MED ORDER — AMLODIPINE BESYLATE 5 MG PO TABS: 5.0000 mg | ORAL_TABLET | Freq: Every day | ORAL | Status: DC

## 2021-05-04 MED ORDER — ACETAMINOPHEN 325 MG PO TABS: 650.0000 mg | ORAL_TABLET | Freq: Four times a day (QID) | ORAL | Status: AC | PRN

## 2021-05-04 MED ORDER — DONEPEZIL HCL 10 MG PO TABS: 10.0000 mg | ORAL_TABLET | Freq: Every day | ORAL | Status: DC

## 2021-05-04 MED ORDER — MEMANTINE HCL 10 MG PO TABS: 10.0000 mg | ORAL_TABLET | Freq: Two times a day (BID) | ORAL | Status: DC

## 2021-05-04 MED ORDER — ACYCLOVIR 5 % EX OINT: TOPICAL_OINTMENT | CUTANEOUS | Status: AC

## 2021-05-04 MED ORDER — PANTOPRAZOLE SODIUM 40 MG PO TBEC: 40.0000 mg | DELAYED_RELEASE_TABLET | Freq: Every day | ORAL | Status: DC

## 2021-05-04 MED ORDER — MAGNESIUM SULFATE 2 GM/50ML IV SOLN
2.0000 g | Freq: Once | INTRAVENOUS | Status: AC
  Administered 2021-05-04: 2 g via INTRAVENOUS
  Filled 2021-05-04: qty 50

## 2021-05-04 MED ORDER — VALACYCLOVIR HCL 500 MG PO TABS
2000.0000 mg | ORAL_TABLET | Freq: Two times a day (BID) | ORAL | Status: DC
  Administered 2021-05-04: 2000 mg
  Filled 2021-05-04: qty 4

## 2021-05-04 MED ORDER — VITAMIN B-12 250 MCG PO TABS: 250.0000 ug | ORAL_TABLET | Freq: Every day | ORAL | Status: DC

## 2021-05-04 MED ORDER — FUROSEMIDE 20 MG PO TABS
20.0000 mg | ORAL_TABLET | ORAL | Status: DC
  Administered 2021-05-04: 20 mg via ORAL
  Filled 2021-05-04: qty 1

## 2021-05-04 MED ORDER — FUROSEMIDE 20 MG PO TABS: 20.0000 mg | ORAL_TABLET | ORAL | Status: DC

## 2021-05-04 NOTE — Progress Notes (Signed)
Attempted to give [facility] report on this patient, no answer.

## 2021-05-04 NOTE — TOC Transition Note (Addendum)
Transition of Care [facility]) - CM/SW Discharge Note   Patient Details  Name: CALEA HRIBAR MRN: [id number] Date of Birth: [birth year]  Transition of Care [facility]) CM/SW Contact:  Mearl Latin, LCSW Phone Number: 05/04/2021, 4:30 PM   Clinical Narrative:    Patient will DC to: [facility] ([facility]) Anticipated DC date: 05/04/21 Family notified: Son and Museum/gallery exhibitions officer by: Sharin Mons (after COVID test comes back negative. RN aware to cancel transport if results return positive).    Per MD patient ready for DC to [facility]. RN to call report prior to discharge [phone]). RN, patient, patient's family, and facility notified of DC. Discharge Summary and FL2 sent to facility. DC packet on chart. Ambulance transport requested for patient.   CSW will sign off for now as social work intervention is no longer needed. Please consult [place] again if new needs arise.     Final next level of care: Skilled Nursing Facility Barriers to Discharge: Barriers Resolved   Patient Goals and CMS Choice Patient states their goals for this hospitalization and ongoing recovery are:: Rehab CMS Medicare.gov Compare Post Acute Care list provided to:: Patient Choice offered to / list presented to : Patient, Adult Children  Discharge Placement   Existing PASRR number confirmed : 05/04/21          Patient chooses bed at: [facility] Patient to be transferred to facility by: [facility] Name of family member notified: [facility] Patient and family notified of of transfer: 05/04/21  Discharge Plan and Services In-house Referral: Clinical Social Work                                   Social Determinants of Health (SDOH) Interventions     Readmission Risk Interventions No flowsheet data found.

## 2021-05-04 NOTE — Discharge Instructions (Signed)
Follow with Primary MD Nodal, Joline Salt, PA-C /SNF physician  Get CBC, CMP,  checked  by Primary MD next visit.    Activity: As tolerated with Full fall precautions use walker/cane & assistance as needed   Disposition SNF   Diet: Heart Healthy .  For Heart failure patients - Check your Weight same time everyday, if you gain over 2 pounds, or you develop in leg swelling, experience more shortness of breath or chest pain, call your Primary MD immediately. Follow Cardiac Low Salt Diet and 1.5 lit/day fluid restriction.   On your next visit with your primary care physician please Get Medicines reviewed and adjusted.   Please request your Prim.MD to go over all Hospital Tests and Procedure/Radiological results at the follow up, please get all Hospital records sent to your Prim MD by signing hospital release before you go home.   If you experience worsening of your admission symptoms, develop shortness of breath, life threatening emergency, suicidal or homicidal thoughts you must seek medical attention immediately by calling 911 or calling your MD immediately  if symptoms less severe.  You Must read complete instructions/literature along with all the possible adverse reactions/side effects for all the Medicines you take and that have been prescribed to you. Take any new Medicines after you have completely understood and accpet all the possible adverse reactions/side effects.   Do not drive, operating heavy machinery, perform activities at heights, swimming or participation in water activities or provide baby sitting services if your were admitted for syncope or siezures until you have seen by Primary MD or a Neurologist and advised to do so again.  Do not drive when taking Pain medications.    Do not take more than prescribed Pain, Sleep and Anxiety Medications  Special Instructions: If you have smoked or chewed Tobacco  in the last 2 yrs please stop smoking, stop any regular Alcohol   and or any Recreational drug use.  Wear Seat belts while driving.   Please note  You were cared for by a hospitalist during your hospital stay. If you have any questions about your discharge medications or the care you received while you were in the hospital after you are discharged, you can call the unit and asked to speak with the hospitalist on call if the hospitalist that took care of you is not available. Once you are discharged, your primary care physician will handle any further medical issues. Please note that NO REFILLS for any discharge medications will be authorized once you are discharged, as it is imperative that you return to your primary care physician (or establish a relationship with a primary care physician if you do not have one) for your aftercare needs so that they can reassess your need for medications and monitor your lab values.

## 2021-05-04 NOTE — TOC Progression Note (Addendum)
Transition of Care [facility]) - Progression Note    Patient Details  Name: Christie Sanders MRN: [id number] Date of Birth: 22-Jan-1946  [facility] CM/SW Contact  Mearl Latin, LCSW Phone Number: 05/04/2021, 2:16 PM  Clinical Narrative:    2pm-SNF approval still pending with [employer]. No answer from [facility] x2. Discussed case with [facility] Supervisor who Environmental health practitioner at [employer] to review.  2:51pm-TOC Leadership was able to receive answer from [facility] and they have approved patient's SNF request. CSW attempted [facility]) admissions again and was able to speak with Dimetris who stated she had spoken with "Thereasa Distance" and he was supposed to get patient's discharge summary. CSW explained unsure who Thereasa Distance is but that this CSW will request discharge summary. CSW spoke with patient's daughter in law, Jeanice Lim (and son), and they are aware of discharge today. CSW will request transport.    Expected Discharge Plan: Skilled Nursing Facility Barriers to Discharge: Continued Medical Work up, SNF Pending bed offer, English as a second language teacher  Expected Discharge Plan and Services Expected Discharge Plan: Skilled Nursing Facility In-house Referral: Clinical Social Work                                             Social Determinants of Health (SDOH) Interventions    Readmission Risk Interventions No flowsheet data found.

## 2021-05-04 NOTE — Care Management Important Message (Signed)
Important Message  Patient Details  Name: Christie Sanders MRN: [id number] Date of Birth: 1946-02-01   Medicare Important Message Given:  Yes     Sherilyn Banker 05/04/2021, 3:25 PM

## 2021-05-04 NOTE — Discharge Summary (Addendum)
Physician Discharge Summary  Christie Sanders [id number] DOB: Sep 25, 1945 DOA: 04/26/2021  PCP: Christie Schwalbe, PA-C  Admit date: 04/26/2021 Discharge date: 05/04/2021  Admitted From: Home Disposition:  SNF  Recommendations for Outpatient Follow-up:  Please obtain BMP/CBC in one week   Discharge Condition:Stable CODE STATUS:DNR Diet recommendation: Heart Healthy    Brief/Interim Summary:    76 year old African-American female with reported history of falling 1 day before admission while going to the kitchen.  Reportedly had hit her head.  Also has had dysuria for about 3 weeks.   I talked to her son Christie Sanders on 04/27/2021 in detail, according to him she was having dysuria where her urine was burning and had a foul smell for the last few weeks, the last 3 to 4 days prior to hospital admission she was not feeling well and weak, she fell 2 days prior to hospital admission, family was trying to treat her the best at home but since she continued to get weaker they brought her to the ER.  Son also adds that she has been having some generalized headache off and on for the last 4 to 6 weeks, she was also diagnosed with dementia several years ago has been pretty mild thus far.   In the ER she was diagnosed with sepsis due to UTI, new onset A. fib RVR and she was admitted to the hospital.    1.Sepsis with AKI, dehydration caused by E. Coli bacteremia /UTI . She is getting IV fluids, sepsis pathophysiology has resolved, she has been adequately hydrated, E. coli is pansensitive , treated with IV Rocephin x7 days.  Continue PT OT , will need SNF.  Blood cultures growing E coli.  No further antibiotics at time of discharge   2.  AKI with Hyponatremia.  Due to acute on chronic diastolic CHF.  EF 55%.  Improving with diuresis, she will be discharged on low-dose Lasix.  Triamterene/hydrochlorothiazide was discontinued at time of discharge   3.  Newly diagnosed A. fib RVR.  Could be paroxysmal or  stress-induced.  Mali vas 2 score is be greater than 3.  She was initially on Cardizem and heparin drip now transition to oral beta-blockers and Eliquis.  Stable echocardiogram and TSH.   4.  Alzheimer's dementia.  Per son has been getting increasingly more confused over the last month or so, supportive care, will remain at risk for delirium in the hospital, minimize narcotics and benzodiazepine.  Head CT is unremarkable no focal deficits for now.  Resumed Aricept and Namenda, per son she has not been taking it for a while.  Remains at risk for metabolic encephalopathy/delirium in the hospital in unfamiliar settings.   5.  Acute hypoxic respiratory failure in the ER.  Required BiPAP ?  Due to acute on chronic diastolic CHF EF [id number].  Improved with diuresis.   6.  Dyslipidemia.  Resumed home dose statin.   7.  GERD.  PPI.   8.  Hypomagnesemia.  Repleted   9.  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  Acute on chronic diastolic CHF -She appears euvolemic at time of discharge currently, she started on low-dose Lasix 20 mg p.o. every other day.   Discharge Diagnoses:  Principal Problem:   Sepsis with acute organ dysfunction without septic shock ([place]) Active Problems:   Essential hypertension   Alzheimer disease ([place])   Acute cystitis without hematuria   Atrial fibrillation with rapid ventricular response ([place])   Acute respiratory failure with hypoxia ([place])  Hyponatremia    Discharge Instructions  Discharge Instructions     Diet - low sodium heart healthy   Complete by: As directed    Discharge instructions   Complete by: As directed    Follow with Primary MD [place], Christie Dublin, PA-C /SNF physician  Get CBC, CMP,  checked  by Primary MD next visit.    Activity: As tolerated with Full fall precautions use walker/cane & assistance as needed   Disposition SNF   Diet: Heart Healthy .  For Heart failure patients - Check your Weight same time everyday, if you gain  over 2 pounds, or you develop in leg swelling, experience more shortness of breath or chest pain, call your Primary MD immediately. Follow Cardiac Low Salt Diet and 1.5 lit/day fluid restriction.   On your next visit with your primary care physician please Get Medicines reviewed and adjusted.   Please request your Prim.MD to go over all Hospital Tests and Procedure/Radiological results at the follow up, please get all Hospital records sent to your Prim MD by signing hospital release before you go home.   If you experience worsening of your admission symptoms, develop shortness of breath, life threatening emergency, suicidal or homicidal thoughts you must seek medical attention immediately by calling 911 or calling your MD immediately  if symptoms less severe.  You Must read complete instructions/literature along with all the possible adverse reactions/side effects for all the Medicines you take and that have been prescribed to you. Take any new Medicines after you have completely understood and accpet all the possible adverse reactions/side effects.   Do not drive, operating heavy machinery, perform activities at heights, swimming or participation in water activities or provide baby sitting services if your were admitted for syncope or siezures until you have seen by Primary MD or a Neurologist and advised to do so again.  Do not drive when taking Pain medications.    Do not take more than prescribed Pain, Sleep and Anxiety Medications  Special Instructions: If you have smoked or chewed Tobacco  in the last 2 yrs please stop smoking, stop any regular Alcohol  and or any Recreational drug use.  Wear Seat belts while driving.   Please note  You were cared for by a hospitalist during your hospital stay. If you have any questions about your discharge medications or the care you received while you were in the hospital after you are discharged, you can call the unit and asked to speak with the  hospitalist on call if the hospitalist that took care of you is not available. Once you are discharged, your primary care physician will handle any further medical issues. Please note that NO REFILLS for any discharge medications will be authorized once you are discharged, as it is imperative that you return to your primary care physician (or establish a relationship with a primary care physician if you do not have one) for your aftercare needs so that they can reassess your need for medications and monitor your lab values.   Increase activity slowly   Complete by: As directed       Allergies as of 05/04/2021       Reactions   Codeine Swelling        Medication List     STOP taking these medications    GOODY HEADACHE PO   omeprazole 20 MG capsule Commonly known as: PRILOSEC   triamterene-hydrochlorothiazide 37.5-25 MG tablet Commonly known as: [employer]  TAKE these medications    acetaminophen 325 MG tablet Commonly known as: TYLENOL Take 2 tablets (650 mg total) by mouth every 6 (six) hours as needed for headache. What changed:  medication strength how much to take reasons to take this   acyclovir ointment 5 % Commonly known as: ZOVIRAX Apply topically every 3 (three) hours for 4 days.   amLODipine 5 MG tablet Commonly known as: NORVASC Take 1 tablet (5 mg total) by mouth daily. Start taking on: May 05, 2021   apixaban 5 MG Tabs tablet Commonly known as: ELIQUIS Take 1 tablet (5 mg total) by mouth 2 (two) times daily.   carvedilol 6.25 MG tablet Commonly known as: COREG Take 1 tablet (6.25 mg total) by mouth 2 (two) times daily with a meal.   donepezil 10 MG tablet Commonly known as: ARICEPT Take 1 tablet (10 mg total) by mouth at bedtime.   furosemide 20 MG tablet Commonly known as: LASIX Take 1 tablet (20 mg total) by mouth every other day.   memantine 10 MG tablet Commonly known as: NAMENDA Take 1 tablet (10 mg total) by mouth 2 (two)  times daily.   oxymetazoline 0.05 % nasal spray Commonly known as: AFRIN Place 1 spray into both nostrils daily as needed for congestion.   pantoprazole 40 MG tablet Commonly known as: PROTONIX Take 1 tablet (40 mg total) by mouth daily. Start taking on: May 05, 2021   pravastatin 40 MG tablet Commonly known as: PRAVACHOL Take 40 mg by mouth at bedtime.   Venlafaxine HCl 150 MG Tb24 Take 150 mg by mouth daily.   vitamin B-12 250 MCG tablet Commonly known as: CYANOCOBALAMIN Take 1 tablet (250 mcg total) by mouth daily. What changed:  medication strength how much to take   VITAMIN D3 PO Take 1 tablet by mouth daily.        Contact information for after-discharge care     Destination     [facility] Preferred SNF .   Service: Skilled Nursing Contact information: [place] [phone]                    Allergies  Allergen Reactions   Codeine Swelling    Consultations: none   Procedures/Studies: DG Chest 2 View  Result Date: 04/26/2021 CLINICAL DATA:  Shortness of breath. EXAM: CHEST - 2 VIEW COMPARISON:  Chest CTA dated 11/12/2017. Chest radiographs dated 11/02/2014. FINDINGS: Interval borderline enlarged heart. Tortuous aorta. Interval small amount of pleural fluid or thickening at the left lateral costophrenic angle. Minimal atelectasis or scarring at the right lateral costophrenic angle. Lumbar spine fixation hardware. Thoracic spine degenerative changes. IMPRESSION: 1. Interval borderline cardiomegaly. 2. Interval small amount of left pleural fluid or thickening. 3. Interval minimal atelectasis or scarring at the right lateral costophrenic angle. Electronically Signed   By: Claudie Revering M.D.   On: 04/26/2021 12:48   CT Head Wo Contrast  Result Date: 04/26/2021 CLINICAL DATA:  Head trauma EXAM: CT HEAD WITHOUT CONTRAST TECHNIQUE: Contiguous axial images were obtained from the base  of the skull through the vertex without intravenous contrast. RADIATION DOSE REDUCTION: This exam was performed according to the departmental dose-optimization program which includes automated exposure control, adjustment of the mA and/or kV according to patient size and/or use of iterative reconstruction technique. COMPARISON:  Head CT dated December 06, 2006 FINDINGS: Brain: Punctate right basal ganglia calcification. No evidence of acute infarction, hemorrhage, hydrocephalus, extra-axial collection  or mass lesion/mass effect. Vascular: No hyperdense vessel or unexpected calcification. Skull: Normal. Negative for fracture or focal lesion. Sinuses/Orbits: No acute finding. Other: None. IMPRESSION: No acute intracranial abnormality. Electronically Signed   By: Yetta Glassman M.D.   On: 04/26/2021 14:35   DG Chest Port 1 View  Result Date: 04/28/2021 CLINICAL DATA:  Shortness of breath. EXAM: PORTABLE CHEST 1 VIEW COMPARISON:  04/27/2020 FINDINGS: 0633 hours. The cardio pericardial silhouette is enlarged. The lungs are clear without focal pneumonia, edema, pneumothorax or pleural effusion. Interstitial markings are diffusely coarsened with chronic features. Bones are diffusely demineralized. Telemetry leads overlie the chest. IMPRESSION: No acute cardiopulmonary findings. Electronically Signed   By: Misty Stanley M.D.   On: 04/28/2021 07:33   DG Chest Port 1 View  Result Date: 04/27/2021 CLINICAL DATA:  76 year old female with shortness of breath. Sepsis. EXAM: PORTABLE CHEST 1 VIEW COMPARISON:  Portable chest 04/26/2021 and earlier. FINDINGS: Portable AP semi upright view at 0557 hours. Slightly lower lung volumes. Stable cardiac and mediastinal contours, no cardiomegaly. Visualized tracheal air column is within normal limits. Mild blunting of the left lung base suggesting small left pleural effusion. But otherwise Allowing for portable technique the lungs are clear. No acute osseous abnormality identified.  Left glenohumeral degeneration. Partially visible lumbar spine hardware. Negative visible bowel gas. IMPRESSION: Small left pleural effusion suspected but no other acute cardiopulmonary abnormality. Electronically Signed   By: Genevie Ann M.D.   On: 04/27/2021 06:43   DG Chest Portable 1 View  Result Date: 04/26/2021 CLINICAL DATA:  Dyspnea EXAM: PORTABLE CHEST 1 VIEW COMPARISON:  12:30 p.m. FINDINGS: Lungs are well expanded, symmetric, and clear. No pneumothorax or pleural effusion. Cardiac size within normal limits. Pulmonary vascularity is normal. Osseous structures are age-appropriate. No acute bone abnormality. IMPRESSION: No active disease. Electronically Signed   By: Fidela Salisbury M.D.   On: 04/26/2021 20:25   ECHOCARDIOGRAM COMPLETE  Result Date: 04/27/2021    ECHOCARDIOGRAM REPORT   Patient Name:   MIKYLA SALATINO Date of Exam: 04/27/2021 Medical Rec #:  [id number]      Height:       60.0 in Accession #:    [id number]     Weight:       239.2 lb Date of Birth:  01/04/1946      BSA:          2.014 m Patient Age:    65 years       BP:           121/89 mmHg Patient Gender: F              HR:           99 bpm. Exam Location:  Inpatient Procedure: 2D Echo, Cardiac Doppler and Color Doppler Indications:    I48.91 AFIB  History:        Patient has no prior history of Echocardiogram examinations.                 Risk Factors:Hypertension and Dyslipidemia.  Sonographer:    Beryle Beams Referring Phys: [place]  1. Left ventricular ejection fraction, by estimation, is 50 to 55%. The left ventricle has low normal function. The left ventricle has no regional wall motion abnormalities. Left ventricular diastolic parameters are indeterminate.  2. Right ventricular systolic function is normal. The right ventricular size is normal. There is normal pulmonary artery systolic pressure.  3. The mitral valve is degenerative. Mild mitral valve regurgitation.  No evidence of mitral stenosis. Moderate mitral  annular calcification.  4. The aortic valve is tricuspid. There is moderate calcification of the aortic valve. There is moderate thickening of the aortic valve. Aortic valve regurgitation is mild. Aortic valve sclerosis/calcification is present, without any evidence of aortic stenosis.  5. The inferior vena cava is normal in size with greater than 50% respiratory variability, suggesting right atrial pressure of 3 mmHg. FINDINGS  Left Ventricle: Left ventricular ejection fraction, by estimation, is 50 to 55%. The left ventricle has low normal function. The left ventricle has no regional wall motion abnormalities. The left ventricular internal cavity size was normal in size. There is no left ventricular hypertrophy. Left ventricular diastolic parameters are indeterminate. Right Ventricle: The right ventricular size is normal. No increase in right ventricular wall thickness. Right ventricular systolic function is normal. There is normal pulmonary artery systolic pressure. The tricuspid regurgitant velocity is 2.74 m/s, and  with an assumed right atrial pressure of 3 mmHg, the estimated right ventricular systolic pressure is 123456 mmHg. Left Atrium: Left atrial size was normal in size. Right Atrium: Right atrial size was normal in size. Pericardium: There is no evidence of pericardial effusion. Mitral Valve: The mitral valve is degenerative in appearance. There is moderate thickening of the mitral valve leaflet(s). There is moderate calcification of the mitral valve leaflet(s). Moderate mitral annular calcification. Mild mitral valve regurgitation. No evidence of mitral valve stenosis. MV peak gradient, 82.1 mmHg. The mean mitral valve gradient is 56.0 mmHg. Tricuspid Valve: The tricuspid valve is normal in structure. Tricuspid valve regurgitation is mild . No evidence of tricuspid stenosis. Aortic Valve: The aortic valve is tricuspid. There is moderate calcification of the aortic valve. There is moderate thickening of  the aortic valve. Aortic valve regurgitation is mild. Aortic regurgitation PHT measures 617 msec. Aortic valve sclerosis/calcification is present, without any evidence of aortic stenosis. Aortic valve mean gradient measures 4.0 mmHg. Aortic valve peak gradient measures 8.0 mmHg. Aortic valve area, by VTI measures 2.36 cm. Pulmonic Valve: The pulmonic valve was normal in structure. Pulmonic valve regurgitation is not visualized. No evidence of pulmonic stenosis. Aorta: The aortic root is normal in size and structure. Venous: The inferior vena cava is normal in size with greater than 50% respiratory variability, suggesting right atrial pressure of 3 mmHg. IAS/Shunts: No atrial level shunt detected by color flow Doppler.  LEFT VENTRICLE PLAX 2D LVIDd:         4.20 cm     Diastology LVIDs:         2.40 cm     LV e' medial:  13.80 cm/s LV PW:         1.10 cm     LV e' lateral: 9.68 cm/s LV IVS:        1.00 cm LVOT diam:     2.10 cm LV SV:         45 LV SV Index:   22 LVOT Area:     3.46 cm  LV Volumes (MOD) LV vol d, MOD A2C: 65.3 ml LV vol d, MOD A4C: 54.7 ml LV vol s, MOD A2C: 24.3 ml LV vol s, MOD A4C: 19.5 ml LV SV MOD A2C:     41.0 ml LV SV MOD A4C:     54.7 ml LV SV MOD BP:      39.7 ml RIGHT VENTRICLE            IVC RV S prime:  9.90 cm/s  IVC diam: 2.10 cm TAPSE (M-mode): 1.9 cm LEFT ATRIUM             Index        RIGHT ATRIUM           Index LA diam:        3.60 cm 1.79 cm/m   RA Area:     15.10 cm LA Vol (A2C):   67.7 ml 33.61 ml/m  RA Volume:   32.50 ml  16.14 ml/m LA Vol (A4C):   43.2 ml 21.45 ml/m LA Biplane Vol: 57.2 ml 28.40 ml/m  AORTIC VALVE                    PULMONIC VALVE AV Area (Vmax):    2.34 cm     PV Vmax:       0.58 m/s AV Area (Vmean):   2.23 cm     PV Vmean:      32.800 cm/s AV Area (VTI):     2.36 cm     PV VTI:        0.079 m AV Vmax:           141.00 cm/s  PV Peak grad:  1.4 mmHg AV Vmean:          95.600 cm/s  PV Mean grad:  1.0 mmHg AV VTI:            0.191 m AV Peak Grad:       8.0 mmHg AV Mean Grad:      4.0 mmHg LVOT Vmax:         95.40 cm/s LVOT Vmean:        61.600 cm/s LVOT VTI:          0.130 m LVOT/AV VTI ratio: 0.68 AI PHT:            617 msec  AORTA Ao Root diam: 3.30 cm MITRAL VALVE              TRICUSPID VALVE MV Area VTI:  0.36 cm    TV Peak grad:   29.4 mmHg MV Peak grad: 82.1 mmHg   TV Mean grad:   21.0 mmHg MV Mean grad: 56.0 mmHg   TV Vmax:        2.71 m/s MV Vmax:      4.53 m/s    TV Vmean:       214.0 cm/s MV Vmean:     352.0 cm/s  TV VTI:         0.74 msec                           TR Peak grad:   30.0 mmHg                           TR Vmax:        274.00 cm/s                            SHUNTS                           Systemic VTI:  0.13 m                           Systemic Diam: 2.10 cm  Jenkins Rouge MD Electronically signed by Jenkins Rouge MD Signature Date/Time: 04/27/2021/12:12:04 PM    Final       Subjective:  She denies any complaints, no significant events as discussed with staff. Discharge Exam: Vitals:   05/04/21 0754 05/04/21 1154  BP: (!) 157/106 132/78  Pulse: 100 89  Resp: 18 18  Temp: 98.1 F (36.7 C) 98.4 F (36.9 C)  SpO2:     Vitals:   05/04/21 0400 05/04/21 0500 05/04/21 0754 05/04/21 1154  BP: 129/83  (!) 157/106 132/78  Pulse: 84  100 89  Resp: 15  18 18   Temp: 97.9 F (36.6 C)  98.1 F (36.7 C) 98.4 F (36.9 C)  TempSrc: Axillary  Oral Oral  SpO2: 98%     Weight:  86.7 kg    Height:        General: Pt is alert, awake, not in acute distress, communicative and pleasant she is alert x3, but she is with dementia and impaired cognition and insight. Cardiovascular: RRR, S1/S2 +, no rubs, no gallops Respiratory: CTA bilaterally, no wheezing, no rhonchi Abdominal: Soft, NT, ND, bowel sounds + Extremities: no edema, no cyanosis    The results of significant diagnostics from this hospitalization (including imaging, microbiology, ancillary and laboratory) are listed below for reference.     Microbiology: Recent  Results (from the past 240 hour(s))  Urine Culture     Status: Abnormal   Collection Time: 04/26/21  1:40 PM   Specimen: Urine, Clean Catch  Result Value Ref Range Status   Specimen Description   Final    URINE, CLEAN CATCH Performed at [facility], [address] [place], [place] [address]    Special Requests   Final    NONE Performed at [facility], [address], [place], [place] [address]    Culture >=100,000 COLONIES/mL ESCHERICHIA COLI (A)  Final   Report Status 04/28/2021 FINAL  Final   Organism ID, Bacteria ESCHERICHIA COLI (A)  Final      Susceptibility   Escherichia coli - MIC*    AMPICILLIN 8 SENSITIVE Sensitive     CEFAZOLIN <=4 SENSITIVE Sensitive     CEFEPIME <=0.12 SENSITIVE Sensitive     CEFTRIAXONE <=0.25 SENSITIVE Sensitive     CIPROFLOXACIN <=0.25 SENSITIVE Sensitive     GENTAMICIN <=1 SENSITIVE Sensitive     IMIPENEM <=0.25 SENSITIVE Sensitive     NITROFURANTOIN <=16 SENSITIVE Sensitive     TRIMETH/SULFA <=20 SENSITIVE Sensitive     AMPICILLIN/SULBACTAM <=2 SENSITIVE Sensitive     PIP/TAZO <=4 SENSITIVE Sensitive     * >=100,000 COLONIES/mL ESCHERICHIA COLI  Blood culture (routine x 2)     Status: Abnormal   Collection Time: 04/26/21  4:01 PM   Specimen: BLOOD  Result Value Ref Range Status   Specimen Description   Final    BLOOD LEFT ANTECUBITAL Performed at [facility], [address], [place], [place] [address]    Special Requests   Final    BOTTLES DRAWN AEROBIC AND ANAEROBIC Blood Culture adequate volume Performed at [facility], [address] [place], [place] [address]    Culture  Setup Time   Final    GRAM NEGATIVE RODS IN BOTH AEROBIC AND ANAEROBIC BOTTLES CRITICAL VALUE NOTED.  VALUE IS CONSISTENT WITH PREVIOUSLY REPORTED AND CALLED VALUE.    Culture (A)  Final    ESCHERICHIA COLI SUSCEPTIBILITIES PERFORMED ON PREVIOUS CULTURE WITHIN THE LAST 5  DAYS. Performed at [facility], [address] [place], [place] [address]    Report Status 05/02/2021 FINAL  Final  Blood culture (routine x 2)     Status: Abnormal   Collection Time: 04/26/21  4:02 PM   Specimen: BLOOD  Result Value Ref Range Status   Specimen Description   Final    BLOOD LEFT ANTECUBITAL Performed at [facility], [address] [place], [place] [address]    Special Requests   Final    BOTTLES DRAWN AEROBIC AND ANAEROBIC Blood Culture adequate volume Performed at [facility], [address] [place], [place] [address]    Culture  Setup Time   Final    GRAM NEGATIVE RODS IN BOTH AEROBIC AND ANAEROBIC BOTTLES CRITICAL RESULT CALLED TO, READ BACK BY AND VERIFIED WITH: Jennell Corner [id number] FCP Performed at [facility], [place] [address] [place], [place] [address]    Culture ESCHERICHIA COLI (A)  Final   Report Status 05/02/2021 FINAL  Final   Organism ID, Bacteria ESCHERICHIA COLI  Final      Susceptibility   Escherichia coli - MIC*    AMPICILLIN 8 SENSITIVE Sensitive     CEFAZOLIN <=4 SENSITIVE Sensitive     CEFEPIME <=0.12 SENSITIVE Sensitive     CEFTAZIDIME <=1 SENSITIVE Sensitive     CEFTRIAXONE <=0.25 SENSITIVE Sensitive     CIPROFLOXACIN <=0.25 SENSITIVE Sensitive     GENTAMICIN <=1 SENSITIVE Sensitive     IMIPENEM <=0.25 SENSITIVE Sensitive     TRIMETH/SULFA <=20 SENSITIVE Sensitive     AMPICILLIN/SULBACTAM 4 SENSITIVE Sensitive     PIP/TAZO <=4 SENSITIVE Sensitive     * ESCHERICHIA COLI  Blood Culture ID Panel (Reflexed)     Status: Abnormal   Collection Time: 04/26/21  4:02 PM  Result Value Ref Range Status   Enterococcus faecalis NOT DETECTED NOT DETECTED Final   Enterococcus Faecium NOT DETECTED NOT DETECTED Final   Listeria monocytogenes NOT DETECTED NOT DETECTED Final   Staphylococcus species NOT DETECTED NOT DETECTED Final   Staphylococcus aureus (BCID) NOT DETECTED NOT DETECTED  Final   Staphylococcus epidermidis NOT DETECTED NOT DETECTED Final   Staphylococcus lugdunensis NOT DETECTED NOT DETECTED Final   Streptococcus species NOT DETECTED NOT DETECTED Final   Streptococcus agalactiae NOT DETECTED NOT DETECTED Final   Streptococcus pneumoniae NOT DETECTED NOT DETECTED Final   Streptococcus pyogenes NOT DETECTED NOT DETECTED Final   A.calcoaceticus-baumannii NOT DETECTED NOT DETECTED Final   Bacteroides fragilis NOT DETECTED NOT DETECTED Final   Enterobacterales DETECTED (A) NOT DETECTED Final    Comment: Enterobacterales represent a large order of gram negative bacteria, not a single organism. CRITICAL RESULT CALLED TO, READ BACK BY AND VERIFIED WITH: Lewis Moccasin. [id number] FCP    Enterobacter cloacae complex NOT DETECTED NOT DETECTED Final   Escherichia coli DETECTED (A) NOT DETECTED Final    Comment: CRITICAL RESULT CALLED TO, READ BACK BY AND VERIFIED WITH: Lewis Moccasin. 1933 [id number] FCP    Klebsiella aerogenes NOT DETECTED NOT DETECTED Final   Klebsiella oxytoca NOT DETECTED NOT DETECTED Final   Klebsiella pneumoniae NOT DETECTED NOT DETECTED Final   Proteus species NOT DETECTED NOT DETECTED Final   Salmonella species NOT DETECTED NOT DETECTED Final   Serratia marcescens NOT DETECTED NOT DETECTED Final   Haemophilus influenzae NOT DETECTED NOT DETECTED Final   Neisseria meningitidis NOT DETECTED NOT DETECTED Final   Pseudomonas aeruginosa NOT DETECTED NOT DETECTED Final   Stenotrophomonas maltophilia NOT DETECTED  NOT DETECTED Final   Candida albicans NOT DETECTED NOT DETECTED Final   Candida auris NOT DETECTED NOT DETECTED Final   Candida glabrata NOT DETECTED NOT DETECTED Final   Candida krusei NOT DETECTED NOT DETECTED Final   Candida parapsilosis NOT DETECTED NOT DETECTED Final   Candida tropicalis NOT DETECTED NOT DETECTED Final   Cryptococcus neoformans/gattii NOT DETECTED NOT DETECTED Final   CTX-M ESBL NOT DETECTED NOT DETECTED Final    Carbapenem resistance IMP NOT DETECTED NOT DETECTED Final   Carbapenem resistance KPC NOT DETECTED NOT DETECTED Final   Carbapenem resistance NDM NOT DETECTED NOT DETECTED Final   Carbapenem resist OXA 48 LIKE NOT DETECTED NOT DETECTED Final   Carbapenem resistance VIM NOT DETECTED NOT DETECTED Final    Comment: Performed at [facility], [address], [place], [place] [address]  Resp Panel by RT-PCR (Flu A&B, Covid) Nasopharyngeal Swab     Status: None   Collection Time: 04/26/21  5:08 PM   Specimen: Nasopharyngeal Swab; Nasopharyngeal(NP) swabs in vial transport medium  Result Value Ref Range Status   SARS Coronavirus 2 by RT PCR NEGATIVE NEGATIVE Final    Comment: (NOTE) SARS-CoV-2 target nucleic acids are NOT DETECTED.  The SARS-CoV-2 RNA is generally detectable in upper respiratory specimens during the acute phase of infection. The lowest concentration of SARS-CoV-2 viral copies this assay can detect is 138 copies/mL. A negative result does not preclude SARS-Cov-2 infection and should not be used as the sole basis for treatment or other patient management decisions. A negative result may occur with  improper specimen collection/handling, submission of specimen other than nasopharyngeal swab, presence of viral mutation(s) within the areas targeted by this assay, and inadequate number of viral copies(<138 copies/mL). A negative result must be combined with clinical observations, patient history, and epidemiological information. The expected result is Negative.  Fact Sheet for Patients:  EntrepreneurPulse.com.au  Fact Sheet for Healthcare Providers:  IncredibleEmployment.be  This test is no t yet approved or cleared by the [place] FDA and  has been authorized for detection and/or diagnosis of SARS-CoV-2 by FDA under an Emergency Use Authorization (EUA). This EUA will remain  in effect (meaning this test can be used) for the  duration of the COVID-19 declaration under Section 564(b)(1) of the Act, 21 U.S.C.section 360bbb-3(b)(1), unless the authorization is terminated  or revoked sooner.       Influenza A by PCR NEGATIVE NEGATIVE Final   Influenza B by PCR NEGATIVE NEGATIVE Final    Comment: (NOTE) The Xpert Xpress SARS-CoV-2/FLU/RSV plus assay is intended as an aid in the diagnosis of influenza from Nasopharyngeal swab specimens and should not be used as a sole basis for treatment. Nasal washings and aspirates are unacceptable for Xpert Xpress SARS-CoV-2/FLU/RSV testing.  Fact Sheet for Patients: EntrepreneurPulse.com.au  Fact Sheet for Healthcare Providers: IncredibleEmployment.be  This test is not yet approved or cleared by the [place] FDA and has been authorized for detection and/or diagnosis of SARS-CoV-2 by FDA under an Emergency Use Authorization (EUA). This EUA will remain in effect (meaning this test can be used) for the duration of the COVID-19 declaration under Section 564(b)(1) of the Act, 21 U.S.C. section 360bbb-3(b)(1), unless the authorization is terminated or revoked.  Performed at [facility], [address], [place], [place] [address]      Labs: BNP (last 3 results) Recent Labs    05/02/21 0035 05/03/21 0200 05/04/21 0306  BNP 156.8* 131.6* 123456*   Basic Metabolic Panel:  Recent Labs  Lab 04/30/21 0227 05/01/21 0040 05/02/21 0035 05/03/21 0200 05/04/21 0306  NA 133* 134* 131* 132* 133*  K 3.2* 3.9 3.7 3.9 3.9  CL 95* 95* 91* 93* 97*  CO2 26 30 28 30 28   GLUCOSE 90 89 86 103* 89  BUN 37* 33* 22 22 15   CREATININE 1.28* 1.06* 0.87 0.96 0.74  CALCIUM 8.9 8.9 8.6* 8.6* 8.7*  MG 1.5* 2.2 1.5* 1.5* 1.5*   Liver Function Tests: Recent Labs  Lab 04/30/21 0227 05/01/21 0040 05/02/21 0035 05/03/21 0200 05/04/21 0306  AST 59* 24 18 32 41  ALT 71* 50* 35 40 51*  ALKPHOS 68 72 67 61 71  BILITOT 0.5  0.6 0.4 0.3 0.4  PROT 5.6* 6.4* 5.4* 4.8* 5.1*  ALBUMIN 2.7* 3.0* 2.4* 2.1* 2.2*   No results for input(s): LIPASE, AMYLASE in the last 168 hours. No results for input(s): AMMONIA in the last 168 hours. CBC: Recent Labs  Lab 04/29/21 0149 04/30/21 0227 05/02/21 0035 05/03/21 0200 05/04/21 0306  WBC 14.6* 16.0* 16.6* 15.5* 16.3*  NEUTROABS 12.1* 11.6* 12.6* 12.7* 13.9*  HGB 12.5 13.2 12.0 11.5* 11.8*  HCT 35.1* 37.4 35.7* 33.3* 34.8*  MCV 91.6 92.3 94.7 95.7 95.6  PLT 172 206 240 327 398   Cardiac Enzymes: No results for input(s): CKTOTAL, CKMB, CKMBINDEX, TROPONINI in the last 168 hours. BNP: Invalid input(s): POCBNP CBG: No results for input(s): GLUCAP in the last 168 hours. D-Dimer No results for input(s): DDIMER in the last 72 hours. Hgb A1c No results for input(s): HGBA1C in the last 72 hours. Lipid Profile No results for input(s): CHOL, HDL, LDLCALC, TRIG, CHOLHDL, LDLDIRECT in the last 72 hours. Thyroid function studies No results for input(s): TSH, T4TOTAL, T3FREE, THYROIDAB in the last 72 hours.  Invalid input(s): FREET3 Anemia work up No results for input(s): VITAMINB12, FOLATE, FERRITIN, TIBC, IRON, RETICCTPCT in the last 72 hours. Urinalysis    Component Value Date/Time   COLORURINE YELLOW 04/26/2021 1340   APPEARANCEUR HAZY (A) 04/26/2021 1340   LABSPEC 1.014 04/26/2021 1340   PHURINE 6.0 04/26/2021 1340   GLUCOSEU NEGATIVE 04/26/2021 1340   HGBUR MODERATE (A) 04/26/2021 1340   BILIRUBINUR NEGATIVE 04/26/2021 1340   KETONESUR TRACE (A) 04/26/2021 1340   PROTEINUR 100 (A) 04/26/2021 1340   UROBILINOGEN 0.2 01/16/2010 1030   NITRITE NEGATIVE 04/26/2021 1340   LEUKOCYTESUR LARGE (A) 04/26/2021 1340   Sepsis Labs Invalid input(s): PROCALCITONIN,  WBC,  LACTICIDVEN Microbiology Recent Results (from the past 240 hour(s))  Urine Culture     Status: Abnormal   Collection Time: 04/26/21  1:40 PM   Specimen: Urine, Clean Catch  Result Value Ref Range  Status   Specimen Description   Final    URINE, CLEAN CATCH Performed at [facility], [address], [place], [place] [address]    Special Requests   Final    NONE Performed at [facility], [address] [place], [place] [address]    Culture >=100,000 COLONIES/mL ESCHERICHIA COLI (A)  Final   Report Status 04/28/2021 FINAL  Final   Organism ID, Bacteria ESCHERICHIA COLI (A)  Final      Susceptibility   Escherichia coli - MIC*    AMPICILLIN 8 SENSITIVE Sensitive     CEFAZOLIN <=4 SENSITIVE Sensitive     CEFEPIME <=0.12 SENSITIVE Sensitive     CEFTRIAXONE <=0.25 SENSITIVE Sensitive     CIPROFLOXACIN <=0.25 SENSITIVE Sensitive     GENTAMICIN <=1 SENSITIVE Sensitive  IMIPENEM <=0.25 SENSITIVE Sensitive     NITROFURANTOIN <=16 SENSITIVE Sensitive     TRIMETH/SULFA <=20 SENSITIVE Sensitive     AMPICILLIN/SULBACTAM <=2 SENSITIVE Sensitive     PIP/TAZO <=4 SENSITIVE Sensitive     * >=100,000 COLONIES/mL ESCHERICHIA COLI  Blood culture (routine x 2)     Status: Abnormal   Collection Time: 04/26/21  4:01 PM   Specimen: BLOOD  Result Value Ref Range Status   Specimen Description   Final    BLOOD LEFT ANTECUBITAL Performed at [facility], [address], [place], [place] [address]    Special Requests   Final    BOTTLES DRAWN AEROBIC AND ANAEROBIC Blood Culture adequate volume Performed at [facility], [address], [place], [place] [address]    Culture  Setup Time   Final    GRAM NEGATIVE RODS IN BOTH AEROBIC AND ANAEROBIC BOTTLES CRITICAL VALUE NOTED.  VALUE IS CONSISTENT WITH PREVIOUSLY REPORTED AND CALLED VALUE.    Culture (A)  Final    ESCHERICHIA COLI SUSCEPTIBILITIES PERFORMED ON PREVIOUS CULTURE WITHIN THE LAST 5 DAYS. Performed at [facility], [place] [address], [place], [place] [address]    Report Status 05/02/2021 FINAL  Final  Blood culture (routine x 2)     Status: Abnormal    Collection Time: 04/26/21  4:02 PM   Specimen: BLOOD  Result Value Ref Range Status   Specimen Description   Final    BLOOD LEFT ANTECUBITAL Performed at [facility], [address], [place], [place] [address]    Special Requests   Final    BOTTLES DRAWN AEROBIC AND ANAEROBIC Blood Culture adequate volume Performed at [facility], [address], [place],  [address]    Culture  Setup Time   Final    GRAM NEGATIVE RODS IN BOTH AEROBIC AND ANAEROBIC BOTTLES CRITICAL RESULT CALLED TO, READ BACK BY AND VERIFIED WITH: Jennell Corner [id number] FCP Performed at [facility], [place] [address], [place], [place] [address]    Culture ESCHERICHIA COLI (A)  Final   Report Status 05/02/2021 FINAL  Final   Organism ID, Bacteria ESCHERICHIA COLI  Final      Susceptibility   Escherichia coli - MIC*    AMPICILLIN 8 SENSITIVE Sensitive     CEFAZOLIN <=4 SENSITIVE Sensitive     CEFEPIME <=0.12 SENSITIVE Sensitive     CEFTAZIDIME <=1 SENSITIVE Sensitive     CEFTRIAXONE <=0.25 SENSITIVE Sensitive     CIPROFLOXACIN <=0.25 SENSITIVE Sensitive     GENTAMICIN <=1 SENSITIVE Sensitive     IMIPENEM <=0.25 SENSITIVE Sensitive     TRIMETH/SULFA <=20 SENSITIVE Sensitive     AMPICILLIN/SULBACTAM 4 SENSITIVE Sensitive     PIP/TAZO <=4 SENSITIVE Sensitive     * ESCHERICHIA COLI  Blood Culture ID Panel (Reflexed)     Status: Abnormal   Collection Time: 04/26/21  4:02 PM  Result Value Ref Range Status   Enterococcus faecalis NOT DETECTED NOT DETECTED Final   Enterococcus Faecium NOT DETECTED NOT DETECTED Final   Listeria monocytogenes NOT DETECTED NOT DETECTED Final   Staphylococcus species NOT DETECTED NOT DETECTED Final   Staphylococcus aureus (BCID) NOT DETECTED NOT DETECTED Final   Staphylococcus epidermidis NOT DETECTED NOT DETECTED Final   Staphylococcus lugdunensis NOT DETECTED NOT DETECTED Final   Streptococcus species NOT DETECTED NOT DETECTED  Final   Streptococcus agalactiae NOT DETECTED NOT DETECTED Final   Streptococcus pneumoniae NOT DETECTED NOT DETECTED Final  Streptococcus pyogenes NOT DETECTED NOT DETECTED Final   A.calcoaceticus-baumannii NOT DETECTED NOT DETECTED Final   Bacteroides fragilis NOT DETECTED NOT DETECTED Final   Enterobacterales DETECTED (A) NOT DETECTED Final    Comment: Enterobacterales represent a large order of gram negative bacteria, not a single organism. CRITICAL RESULT CALLED TO, READ BACK BY AND VERIFIED WITH: Earl Gala. [id number] FCP    Enterobacter cloacae complex NOT DETECTED NOT DETECTED Final   Escherichia coli DETECTED (A) NOT DETECTED Final    Comment: CRITICAL RESULT CALLED TO, READ BACK BY AND VERIFIED WITH: Earl Gala. [id number] FCP    Klebsiella aerogenes NOT DETECTED NOT DETECTED Final   Klebsiella oxytoca NOT DETECTED NOT DETECTED Final   Klebsiella pneumoniae NOT DETECTED NOT DETECTED Final   Proteus species NOT DETECTED NOT DETECTED Final   Salmonella species NOT DETECTED NOT DETECTED Final   Serratia marcescens NOT DETECTED NOT DETECTED Final   Haemophilus influenzae NOT DETECTED NOT DETECTED Final   Neisseria meningitidis NOT DETECTED NOT DETECTED Final   Pseudomonas aeruginosa NOT DETECTED NOT DETECTED Final   Stenotrophomonas maltophilia NOT DETECTED NOT DETECTED Final   Candida albicans NOT DETECTED NOT DETECTED Final   Candida auris NOT DETECTED NOT DETECTED Final   Candida glabrata NOT DETECTED NOT DETECTED Final   Candida krusei NOT DETECTED NOT DETECTED Final   Candida parapsilosis NOT DETECTED NOT DETECTED Final   Candida tropicalis NOT DETECTED NOT DETECTED Final   Cryptococcus neoformans/gattii NOT DETECTED NOT DETECTED Final   CTX-M ESBL NOT DETECTED NOT DETECTED Final   Carbapenem resistance IMP NOT DETECTED NOT DETECTED Final   Carbapenem resistance KPC NOT DETECTED NOT DETECTED Final   Carbapenem resistance NDM NOT DETECTED NOT DETECTED Final    Carbapenem resist OXA 48 LIKE NOT DETECTED NOT DETECTED Final   Carbapenem resistance VIM NOT DETECTED NOT DETECTED Final    Comment: Performed at [facility], [address] [place], [place] [address]  Resp Panel by RT-PCR (Flu A&B, Covid) Nasopharyngeal Swab     Status: None   Collection Time: 04/26/21  5:08 PM   Specimen: Nasopharyngeal Swab; Nasopharyngeal(NP) swabs in vial transport medium  Result Value Ref Range Status   SARS Coronavirus 2 by RT PCR NEGATIVE NEGATIVE Final    Comment: (NOTE) SARS-CoV-2 target nucleic acids are NOT DETECTED.  The SARS-CoV-2 RNA is generally detectable in upper respiratory specimens during the acute phase of infection. The lowest concentration of SARS-CoV-2 viral copies this assay can detect is 138 copies/mL. A negative result does not preclude SARS-Cov-2 infection and should not be used as the sole basis for treatment or other patient management decisions. A negative result may occur with  improper specimen collection/handling, submission of specimen other than nasopharyngeal swab, presence of viral mutation(s) within the areas targeted by this assay, and inadequate number of viral copies(<138 copies/mL). A negative result must be combined with clinical observations, patient history, and epidemiological information. The expected result is Negative.  Fact Sheet for Patients:  BloggerCourse.com  Fact Sheet for Healthcare Providers:  SeriousBroker.it  This test is no t yet approved or cleared by the [place] FDA and  has been authorized for detection and/or diagnosis of SARS-CoV-2 by FDA under an Emergency Use Authorization (EUA). This EUA will remain  in effect (meaning this test can be used) for the duration of the COVID-19 declaration under Section 564(b)(1) of the Act, 21 U.S.C.section 360bbb-3(b)(1), unless the authorization is terminated  or revoked sooner.  Influenza A  by PCR NEGATIVE NEGATIVE Final   Influenza B by PCR NEGATIVE NEGATIVE Final    Comment: (NOTE) The Xpert Xpress SARS-CoV-2/FLU/RSV plus assay is intended as an aid in the diagnosis of influenza from Nasopharyngeal swab specimens and should not be used as a sole basis for treatment. Nasal washings and aspirates are unacceptable for Xpert Xpress SARS-CoV-2/FLU/RSV testing.  Fact Sheet for Patients: EntrepreneurPulse.com.au  Fact Sheet for Healthcare Providers: IncredibleEmployment.be  This test is not yet approved or cleared by the [place] FDA and has been authorized for detection and/or diagnosis of SARS-CoV-2 by FDA under an Emergency Use Authorization (EUA). This EUA will remain in effect (meaning this test can be used) for the duration of the COVID-19 declaration under Section 564(b)(1) of the Act, 21 U.S.C. section 360bbb-3(b)(1), unless the authorization is terminated or revoked.  Performed at [facility], [address] [place], [place] [address]      Time coordinating discharge: Over 30 minutes  SIGNED:   Phillips Climes, MD  [facility] 05/04/2021, 3:07 PM Pager   If 7PM-7AM, please contact night-coverage [email or web address] Password [email or web address]

## 2021-05-04 NOTE — Progress Notes (Signed)
Occupational Therapy Treatment Patient Details Name: Christie Sanders MRN: [id number] DOB: [birth year] Today's Date: 05/04/2021   History of present illness 76 yo female with onset of a fall at home in which she struck her head was admitted on 2.2 with ongoing UTI and noted acute resp failure with A-fib and RVR due to sepsis.  Pt is weak and on O2, has worsened symptoms of HR and breathing struggles to contend with.  PMHx:  Alz dementia, HA's, fibromyalgia, HTN   OT comments  Pt making progress with functional goals. Pt in recliner upon arrival with posture scooting down in chair and c/o being tired of siting up with buttocks being sore. Session focused on si t- stand for chair x 3 trials with cues for hand placement, SPTs to sit EOB mod - min A, grooming and UB dressing seated EOB, mod A with LEs back onto bed; pt able to use rails and LEs to scoot self to [facility]. OT will continue to follow acutely to maximize level of function and safety   Recommendations for follow up therapy are one component of a multi-disciplinary discharge planning process, led by the attending physician.  Recommendations may be updated based on patient status, additional functional criteria and insurance authorization.    Follow Up Recommendations  Skilled nursing-short term rehab (<3 hours/day)    Assistance Recommended at Discharge Frequent or constant Supervision/Assistance  Patient can return home with the following  A lot of help with bathing/dressing/bathroom;A lot of help with walking and/or transfers;Direct supervision/assist for medications management;Direct supervision/assist for financial management   Equipment Recommendations  Other (comment) (TBD at SNF)    Recommendations for Other Services      Precautions / Restrictions Precautions Precautions: Fall Precaution Comments: monitor HR and O2 sats Restrictions Weight Bearing Restrictions: No       Mobility Bed Mobility Overal bed mobility: Needs  Assistance         Sit to supine: Mod assist   General bed mobility comments: pt in recliner upon arival, assist with LEs back onto bed    Transfers Overall transfer level: Needs assistance Equipment used: None Transfers: Sit to/from Stand, Bed to chair/wheelchair/BSC Sit to Stand: Mod assist Stand pivot transfers: Min assist         General transfer comment: verbal cues for hand placement, sequencing     Balance Overall balance assessment: History of Falls, Needs assistance Sitting-balance support: Feet supported, No upper extremity supported Sitting balance-Leahy Scale: Fair     Standing balance support: Bilateral upper extremity supported, During functional activity Standing balance-Leahy Scale: Poor                             ADL either performed or assessed with clinical judgement   ADL Overall ADL's : Needs assistance/impaired     Grooming: Wash/dry hands;Wash/dry face;Brushing hair;Set up;Supervision/safety;Sitting           Upper Body Dressing : Min guard;Sitting       Toilet Transfer: Moderate assistance;Minimal assistance;Stand-pivot;Cueing for safety;Cueing for sequencing Toilet Transfer Details (indicate cue type and reason): simulated chair to bed Toileting- Clothing Manipulation and Hygiene: Maximal assistance;Sit to/from stand;Cueing for safety       Functional mobility during ADLs: Moderate assistance;Minimal assistance;Cueing for safety;Cueing for sequencing      Extremity/Trunk Assessment Upper Extremity Assessment Upper Extremity Assessment: Generalized weakness   Lower Extremity Assessment Lower Extremity Assessment: Defer to PT evaluation   Cervical / Trunk Assessment  Cervical / Trunk Assessment: Kyphotic    Vision Baseline Vision/History: 1 Wears glasses Ability to See in Adequate Light: 0 Adequate Patient Visual Report: No change from baseline     Perception     Praxis      Cognition Arousal/Alertness:  Awake/alert Behavior During Therapy: WFL for tasks assessed/performed Overall Cognitive Status: History of cognitive impairments - at baseline                                          Exercises      Shoulder Instructions       General Comments      Pertinent Vitals/ Pain       Pain Assessment Pain Assessment: Faces Faces Pain Scale: Hurts little more Pain Location: pt reports general soreness in buttocks Pain Descriptors / Indicators: Grimacing Pain Intervention(s): Monitored during session, Repositioned  Home Living                                          Prior Functioning/Environment              Frequency  Min 2X/week        Progress Toward Goals  OT Goals(current goals can now be found in the care plan section)  Progress towards OT goals: Progressing toward goals     Plan Discharge plan remains appropriate;Frequency remains appropriate    Co-evaluation                 AM-PAC OT "6 Clicks" Daily Activity     Outcome Measure   Help from another person eating meals?: None Help from another person taking care of personal grooming?: A Little Help from another person toileting, which includes using toliet, bedpan, or urinal?: A Lot Help from another person bathing (including washing, rinsing, drying)?: A Lot Help from another person to put on and taking off regular upper body clothing?: A Little Help from another person to put on and taking off regular lower body clothing?: Total 6 Click Score: 15    End of Session Equipment Utilized During Treatment: Gait belt  Pain - part of body:  (buttocks)   Activity Tolerance Patient limited by fatigue   Patient Left in bed;with call bell/phone within reach;with bed alarm set;with nursing/sitter in room   Nurse Communication          Time: 2353-6144 OT Time Calculation (min): 18 min  Charges: OT General Charges $OT Visit: 1 Visit OT Treatments $Therapeutic  Activity: 8-22 mins    Galen Manila 05/04/2021, 3:44 PM

## 2021-05-04 NOTE — Progress Notes (Signed)
Physical Therapy Treatment Patient Details Name: Christie Sanders MRN: [id number] DOB: Jul 25, 1945 Today's Date: 05/04/2021   History of Present Illness 76 yo female with onset of a fall at home in which she struck her head was admitted on 2.2 with ongoing UTI and noted acute resp failure with A-fib and RVR due to sepsis.  Pt is weak and on O2, has worsened symptoms of HR and breathing struggles to contend with.  PMHx:  Alz dementia, HA's, fibromyalgia, HTN    PT Comments    Pt was seen for two sessions but were completion of the first session.  Pt requires help to move on the bed, and then was assisted to exercise her legs after she got OOB in the interim during and after her meal.  Pt is progressing slowly due to pain and edema on LE's but is motivated to get home.  Recommend SNF still due to her work on gait and strength that is needed.  Pt is going to continue to work on goals as outlined in acute PT POC,   Recommendations for follow up therapy are one component of a multi-disciplinary discharge planning process, led by the attending physician.  Recommendations may be updated based on patient status, additional functional criteria and insurance authorization.  Follow Up Recommendations  Skilled nursing-short term rehab (<3 hours/day)     Assistance Recommended at Discharge Frequent or constant Supervision/Assistance  Patient can return home with the following Two people to help with walking and/or transfers;Two people to help with bathing/dressing/bathroom;Assistance with cooking/housework;Help with stairs or ramp for entrance;Assist for transportation   Equipment Recommendations  None recommended by PT    Recommendations for Other Services       Precautions / Restrictions Precautions Precautions: Fall Precaution Comments: monitor HR and O2 sats Restrictions Weight Bearing Restrictions: No     Mobility  Bed Mobility Overal bed mobility: Needs Assistance Bed Mobility:  (scooting  up bed)           General bed mobility comments: up in chair when PT returns, max assist to scoot up bed    Transfers Overall transfer level: Needs assistance                 General transfer comment: up inchair when PT returns    Ambulation/Gait                   Stairs             Wheelchair Mobility    Modified Rankin (Stroke Patients Only)       Balance Overall balance assessment: History of Falls, Needs assistance Sitting-balance support: Feet supported Sitting balance-Leahy Scale: Fair                                      Cognition Arousal/Alertness: Awake/alert Behavior During Therapy: WFL for tasks assessed/performed Overall Cognitive Status: History of cognitive impairments - at baseline                                 General Comments: pt requires repetitive instructions for movement        Exercises General Exercises - Lower Extremity Ankle Circles/Pumps: AAROM, 5 reps Quad Sets: AROM, 10 reps Gluteal Sets: AROM, 10 reps Long Arc Quad: AAROM, 10 reps Heel Slides: AAROM, 10 reps Hip ABduction/ADduction: AAROM, 10 reps  General Comments        Pertinent Vitals/Pain Pain Assessment Pain Assessment: Faces Faces Pain Scale: Hurts a little bit Pain Location: soreness on legs from bruising Pain Descriptors / Indicators: Guarding Pain Intervention(s): Monitored during session, Repositioned    Home Living                          Prior Function            PT Goals (current goals can now be found in the care plan section) Acute Rehab PT Goals Patient Stated Goal: to get home and feel better    Frequency    Min 2X/week      PT Plan Current plan remains appropriate    Co-evaluation              AM-PAC PT "6 Clicks" Mobility   Outcome Measure  Help needed turning from your back to your side while in a flat bed without using bedrails?: A Little Help needed  moving from lying on your back to sitting on the side of a flat bed without using bedrails?: A Lot Help needed moving to and from a bed to a chair (including a wheelchair)?: A Lot Help needed standing up from a chair using your arms (e.g., wheelchair or bedside chair)?: A Lot Help needed to walk in hospital room?: Total Help needed climbing 3-5 steps with a railing? : Total 6 Click Score: 11    End of Session Equipment Utilized During Treatment: Oxygen Activity Tolerance: Patient limited by pain Patient left: in chair;with call bell/phone within reach;with chair alarm set Nurse Communication: Mobility status PT Visit Diagnosis: Muscle weakness (generalized) (M62.81);Difficulty in walking, not elsewhere classified (R26.2);History of falling (Z91.81)     Time: [id number] (+1431 to 1441) PT Time Calculation (min) (ACUTE ONLY): 14 min  Charges:  $Therapeutic Exercise: 8-22 mins $Therapeutic Activity: 8-22 mins     Ramond Dial 05/04/2021, 7:13 PM  Mee Hives, PT PhD Acute Rehab Dept. Number: [place] and [place]

## 2021-05-04 NOTE — Progress Notes (Signed)
PROGRESS NOTE                                                                                                                                                                                                             Patient Demographics:    Christie Sanders, is a 76 y.o. female, DOB - [birth year], TRR:[id number]  Outpatient Primary MD for the patient is Nodal, Joline Salt, PA-C    LOS - 8  Admit date - 04/26/2021    Chief Complaint  Patient presents with   Altered Mental Status       Brief Narrative (HPI from H&P)  -   76 year old African-American female with reported history of falling 1 day before admission while going to the kitchen.  Reportedly had hit her head.  Also has had dysuria for about 3 weeks.  I talked to her son Jonny Ruiz on 04/27/2021 in detail, according to him she was having dysuria where her urine was burning and had a foul smell for the last few weeks, the last 3 to 4 days prior to hospital admission she was not feeling well and weak, she fell 2 days prior to hospital admission, family was trying to treat her the best at home but since she continued to get weaker they brought her to the ER.  Son also adds that she has been having some generalized headache off and on for the last 4 to 6 weeks, she was also diagnosed with dementia several years ago has been pretty mild thus far.  In the ER she was diagnosed with sepsis due to UTI, new onset A. fib RVR and she was admitted to the hospital.   Subjective:   Patient in bed, in no apparent distress, she denies any complaints, she was asking when she will be able to go to rehab.     Assessment  & Plan :    1.Sepsis with AKI, dehydration caused by E. Coli UTI . She is getting IV fluids, sepsis pathophysiology has resolved, she has been adequately hydrated, E. coli is pansensitive , IV Rocephin x7 days.  Continue PT OT , will need SNF.  Blood cultures surprisingly negative  despite very high procalcitonin.  2.  AKI with Hyponatremia.  Due to acute on chronic diastolic CHF.  EF 55%.  Improving with diuresis,   3.  Newly diagnosed  A. fib RVR.  Could be paroxysmal or stress-induced.  [place] vas 2 score is be greater than 3.  She was initially on Cardizem and heparin drip now transition to oral Cardizem and Eliquis.  Stable echocardiogram and TSH.  4.  Alzheimer's dementia.  Per son has been getting increasingly more confused over the last month or so, supportive care, will remain at risk for delirium in the hospital, minimize narcotics and benzodiazepine.  Head CT is unremarkable no focal deficits for now.  Resumed Aricept and Namenda, per son she has not been taking it for a while.  Remains at risk for metabolic encephalopathy/delirium in the hospital in unfamiliar settings.  5.  Acute hypoxic respiratory failure in the ER.  Required BiPAP ?  Due to acute on chronic diastolic CHF EF 55%.  Improved with diuresis.  6.  Dyslipidemia.  Resumed home dose statin.  7.  GERD.  PPI.  8.  Hypomagnesemia.  Repleted  9.  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  Patient with infiltrated IV in the right arm, discussed with staff, IV site  has been changed to other arm, and will elevate right arm welling.      Condition - Extremely Guarded  Family Communication  : None at bedside   Code Status :  DNR  Consults  : None  PUD Prophylaxis :     Procedures  :     TTE - 1. Left ventricular ejection fraction, by estimation, is 50 to 55%. The left ventricle has low normal function. The left ventricle has no regional wall motion abnormalities. Left ventricular diastolic parameters are indeterminate.  2. Right ventricular systolic function is normal. The right ventricular size is normal. There is normal pulmonary artery systolic pressure.  3. The mitral valve is degenerative. Mild mitral valve regurgitation. No evidence of mitral stenosis. Moderate  mitral annular calcification.  4. The aortic valve is tricuspid. There is moderate calcification of the aortic valve. There is moderate thickening of the aortic valve. Aortic valve regurgitation is mild. Aortic valve sclerosis/calcification is present, without any evidence of aortic stenosis.  5. The inferior vena cava is normal in size with greater than 50% respiratory variability, suggesting right atrial pressure of 3 mmHg.   CT Head - Non Acute      Disposition Plan  : Patient is medically stable for discharge once insurance authorization has been obtained.  Status is: Inpatient  DVT Prophylaxis  :    SCDs Start: 04/27/21 0049 SCDs Start: 04/27/21 0049 apixaban (ELIQUIS) tablet 5 mg    Lab Results  Component Value Date   PLT 398 05/04/2021    Diet :  Diet Order             Diet regular Room service appropriate? Yes with Assist; Fluid consistency: Thin; Fluid restriction: 1200 mL Fluid  Diet effective now                    Inpatient Medications  Scheduled Meds:  acyclovir ointment   Topical Q3H   amLODipine  5 mg Oral Daily   apixaban  5 mg Oral BID   carvedilol  6.25 mg Oral BID WC   cyclobenzaprine  5 mg Oral QHS   donepezil  10 mg Oral QHS   memantine  10 mg Oral BID   ondansetron (ZOFRAN) IV  4 mg Intravenous Once   pantoprazole  40 mg Oral Daily   pravastatin  40 mg Oral q1800  sodium chloride flush  10-40 mL Intracatheter Q12H   valACYclovir  2,000 mg Per Tube BID   venlafaxine  75 mg Oral TID WC   Continuous Infusions:   PRN Meds:.acetaminophen, diltiazem, levalbuterol, sodium chloride flush  Antibiotics  :    Anti-infectives (From admission, onward)    Start     Dose/Rate Route Frequency Ordered Stop   05/04/21 1015  valACYclovir (VALTREX) tablet 2,000 mg        2,000 mg Per Tube 2 times daily 05/04/21 0922 05/05/21 0959   04/27/21 2000  vancomycin (VANCOREADY) IVPB 1250 mg/250 mL  Status:  Discontinued        1,250 mg 166.7 mL/hr over 90  Minutes Intravenous Every 24 hours 04/26/21 1742 04/27/21 0048   04/27/21 0800  cefTRIAXone (ROCEPHIN) 2 g in sodium chloride 0.9 % 100 mL IVPB  Status:  Discontinued        2 g 200 mL/hr over 30 Minutes Intravenous Every 24 hours 04/27/21 0048 05/02/21 1511   04/26/21 1930  vancomycin (VANCOCIN) IVPB 1000 mg/200 mL premix        1,000 mg 200 mL/hr over 60 Minutes Intravenous  Once 04/26/21 1742 04/26/21 2144   04/26/21 1830  vancomycin (VANCOCIN) IVPB 1000 mg/200 mL premix        1,000 mg 200 mL/hr over 60 Minutes Intravenous  Once 04/26/21 1742 04/26/21 1905   04/26/21 1800  ceFEPIme (MAXIPIME) 2 g in sodium chloride 0.9 % 100 mL IVPB  Status:  Discontinued        2 g 200 mL/hr over 30 Minutes Intravenous Every 12 hours 04/26/21 1742 04/27/21 0048   04/26/21 1500  cefTRIAXone (ROCEPHIN) 1 g in sodium chloride 0.9 % 100 mL IVPB        1 g 200 mL/hr over 30 Minutes Intravenous  Once 04/26/21 1458 04/26/21 1947         Ileane Sando M.D on 05/04/2021 at 12:23 PM  To page go to [email or web address]   [facility] -  Office  [phone]  See all Orders from today for further details    Objective:   Vitals:   05/04/21 0400 05/04/21 0500 05/04/21 0754 05/04/21 1154  BP: 129/83  (!) 157/106 132/78  Pulse: 84  100 89  Resp: 15  18 18   Temp: 97.9 F (36.6 C)  98.1 F (36.7 C) 98.4 F (36.9 C)  TempSrc: Axillary  Oral Oral  SpO2: 98%     Weight:  86.7 kg    Height:        Wt Readings from Last 3 Encounters:  05/04/21 86.7 kg  09/07/13 108.5 kg     Intake/Output Summary (Last 24 hours) at 05/04/2021 1223 Last data filed at 05/04/2021 0535 Gross per 24 hour  Intake 180 ml  Output 1700 ml  Net -1520 ml     Physical Exam  Awake Alert, communicative, pleasant Symmetrical Chest wall movement, Good air movement bilaterally, CTAB RRR,No Gallops,Rubs or new Murmurs, No Parasternal Heave +ve B.Sounds, Abd Soft, No tenderness, No rebound - guarding or rigidity. No  Cyanosis, Clubbing or edema, No new Rash or bruise       Data Review:    CBC Recent Labs  Lab 04/29/21 0149 04/30/21 0227 05/02/21 0035 05/03/21 0200 05/04/21 0306  WBC 14.6* 16.0* 16.6* 15.5* 16.3*  HGB 12.5 13.2 12.0 11.5* 11.8*  HCT 35.1* 37.4 35.7* 33.3* 34.8*  PLT 172 206 240 327 398  MCV 91.6 92.3 94.7 95.7 95.6  MCH  32.6 32.6 31.8 33.0 32.4  MCHC 35.6 35.3 33.6 34.5 33.9  RDW 13.0 13.2 13.2 13.3 13.2  LYMPHSABS 0.9 1.7 1.8 2.5 1.6  MONOABS 1.5* 2.4* 1.2* 0.2 0.2  EOSABS 0.0 0.0 0.7* 0.2 0.7*  BASOSABS 0.0 0.0 0.0 0.0 0.0    Electrolytes Recent Labs  Lab 04/28/21 0036 04/29/21 0149 04/30/21 0227 05/01/21 0040 05/02/21 0035 05/03/21 0200 05/04/21 0306  NA 129* 129* 133* 134* 131* 132* 133*  K 2.9* 4.3 3.2* 3.9 3.7 3.9 3.9  CL 93* 94* 95* 95* 91* 93* 97*  CO2 23 25 26 30 28 30 28   GLUCOSE 141* 116* 90 89 86 103* 89  BUN 32* 37* 37* 33* 22 22 15   CREATININE 1.43* 1.22* 1.28* 1.06* 0.87 0.96 0.74  CALCIUM 8.6* 8.8* 8.9 8.9 8.6* 8.6* 8.7*  AST 36 63* 59* 24 18 32 41  ALT 29 52* 71* 50* 35 40 51*  ALKPHOS 86 80 68 72 67 61 71  BILITOT 0.6 0.9 0.5 0.6 0.4 0.3 0.4  ALBUMIN 2.7* 2.5* 2.7* 3.0* 2.4* 2.1* 2.2*  MG 1.8 1.9 1.5* 2.2 1.5* 1.5* 1.5*  CRP 33.8* 19.0* 9.1*  --   --   --   --   PROCALCITON 18.66 7.50 3.61  --   --   --   --   BNP 686.2* 141.0* 123.2*  --  156.8* 131.6* 114.5*    ------------------------------------------------------------------------------------------------------------------ No results for input(s): CHOL, HDL, LDLCALC, TRIG, CHOLHDL, LDLDIRECT in the last 72 hours.  No results found for: HGBA1C  No results for input(s): TSH, T4TOTAL, T3FREE, THYROIDAB in the last 72 hours.  Invalid input(s): FREET3  ------------------------------------------------------------------------------------------------------------------ ID Labs Recent Labs  Lab 04/28/21 0036 04/29/21 0149 04/30/21 0227 05/01/21 0040 05/02/21 0035 05/03/21 0200  05/04/21 0306  WBC 13.0* 14.6* 16.0*  --  16.6* 15.5* 16.3*  PLT 126* 172 206  --  240 327 398  CRP 33.8* 19.0* 9.1*  --   --   --   --   PROCALCITON 18.66 7.50 3.61  --   --   --   --   CREATININE 1.43* 1.22* 1.28* 1.06* 0.87 0.96 0.74   Cardiac Enzymes No results for input(s): CKMB, TROPONINI, MYOGLOBIN in the last 168 hours.  Invalid input(s): CK   Radiology Reports No results found.

## 2021-05-07 DIAGNOSIS — G309 Alzheimer's disease, unspecified: Secondary | ICD-10-CM | POA: Diagnosis not present

## 2021-05-07 DIAGNOSIS — N179 Acute kidney failure, unspecified: Secondary | ICD-10-CM | POA: Diagnosis not present

## 2021-05-07 DIAGNOSIS — E785 Hyperlipidemia, unspecified: Secondary | ICD-10-CM | POA: Diagnosis not present

## 2021-05-07 DIAGNOSIS — F0280 Dementia in other diseases classified elsewhere without behavioral disturbance: Secondary | ICD-10-CM | POA: Diagnosis not present

## 2021-05-07 DIAGNOSIS — K219 Gastro-esophageal reflux disease without esophagitis: Secondary | ICD-10-CM | POA: Diagnosis not present

## 2021-05-07 DIAGNOSIS — I4891 Unspecified atrial fibrillation: Secondary | ICD-10-CM | POA: Diagnosis not present

## 2021-05-07 DIAGNOSIS — A419 Sepsis, unspecified organism: Secondary | ICD-10-CM | POA: Diagnosis not present

## 2021-05-07 DIAGNOSIS — N390 Urinary tract infection, site not specified: Secondary | ICD-10-CM | POA: Diagnosis not present

## 2021-05-10 DIAGNOSIS — N179 Acute kidney failure, unspecified: Secondary | ICD-10-CM | POA: Diagnosis not present

## 2021-05-10 DIAGNOSIS — B001 Herpesviral vesicular dermatitis: Secondary | ICD-10-CM | POA: Diagnosis not present

## 2021-05-10 DIAGNOSIS — I5033 Acute on chronic diastolic (congestive) heart failure: Secondary | ICD-10-CM | POA: Diagnosis not present

## 2021-05-10 DIAGNOSIS — I4891 Unspecified atrial fibrillation: Secondary | ICD-10-CM | POA: Diagnosis not present

## 2021-05-10 DIAGNOSIS — E860 Dehydration: Secondary | ICD-10-CM | POA: Diagnosis not present

## 2021-05-10 DIAGNOSIS — G309 Alzheimer's disease, unspecified: Secondary | ICD-10-CM | POA: Diagnosis not present

## 2021-05-14 DIAGNOSIS — U071 COVID-19: Secondary | ICD-10-CM | POA: Diagnosis not present

## 2021-05-14 DIAGNOSIS — I517 Cardiomegaly: Secondary | ICD-10-CM | POA: Diagnosis not present

## 2021-05-14 DIAGNOSIS — G309 Alzheimer's disease, unspecified: Secondary | ICD-10-CM | POA: Diagnosis not present

## 2021-05-14 DIAGNOSIS — Z1152 Encounter for screening for COVID-19: Secondary | ICD-10-CM | POA: Diagnosis not present

## 2021-05-16 DIAGNOSIS — U071 COVID-19: Secondary | ICD-10-CM | POA: Diagnosis not present

## 2021-05-16 DIAGNOSIS — G309 Alzheimer's disease, unspecified: Secondary | ICD-10-CM | POA: Diagnosis not present

## 2021-06-04 DIAGNOSIS — E785 Hyperlipidemia, unspecified: Secondary | ICD-10-CM | POA: Diagnosis not present

## 2021-06-04 DIAGNOSIS — G301 Alzheimer's disease with late onset: Secondary | ICD-10-CM | POA: Diagnosis not present

## 2021-06-04 DIAGNOSIS — F332 Major depressive disorder, recurrent severe without psychotic features: Secondary | ICD-10-CM | POA: Diagnosis not present

## 2021-06-04 DIAGNOSIS — F03A3 Unspecified dementia, mild, with mood disturbance: Secondary | ICD-10-CM | POA: Diagnosis not present

## 2021-06-04 DIAGNOSIS — K219 Gastro-esophageal reflux disease without esophagitis: Secondary | ICD-10-CM | POA: Diagnosis not present

## 2021-06-04 DIAGNOSIS — G309 Alzheimer's disease, unspecified: Secondary | ICD-10-CM | POA: Diagnosis not present

## 2021-06-04 DIAGNOSIS — H6690 Otitis media, unspecified, unspecified ear: Secondary | ICD-10-CM | POA: Diagnosis not present

## 2021-06-25 DIAGNOSIS — F332 Major depressive disorder, recurrent severe without psychotic features: Secondary | ICD-10-CM | POA: Diagnosis not present

## 2021-06-25 DIAGNOSIS — G301 Alzheimer's disease with late onset: Secondary | ICD-10-CM | POA: Diagnosis not present

## 2021-06-25 DIAGNOSIS — F03A3 Unspecified dementia, mild, with mood disturbance: Secondary | ICD-10-CM | POA: Diagnosis not present

## 2021-07-09 DIAGNOSIS — R300 Dysuria: Secondary | ICD-10-CM | POA: Diagnosis not present

## 2021-07-09 DIAGNOSIS — I110 Hypertensive heart disease with heart failure: Secondary | ICD-10-CM | POA: Diagnosis not present

## 2021-07-09 DIAGNOSIS — G309 Alzheimer's disease, unspecified: Secondary | ICD-10-CM | POA: Diagnosis not present

## 2021-07-12 DIAGNOSIS — E860 Dehydration: Secondary | ICD-10-CM | POA: Diagnosis not present

## 2021-07-12 DIAGNOSIS — K219 Gastro-esophageal reflux disease without esophagitis: Secondary | ICD-10-CM | POA: Diagnosis not present

## 2021-07-12 DIAGNOSIS — N179 Acute kidney failure, unspecified: Secondary | ICD-10-CM | POA: Diagnosis not present

## 2021-07-12 DIAGNOSIS — U071 COVID-19: Secondary | ICD-10-CM | POA: Diagnosis not present

## 2021-07-12 DIAGNOSIS — G309 Alzheimer's disease, unspecified: Secondary | ICD-10-CM | POA: Diagnosis not present

## 2021-07-12 DIAGNOSIS — I5033 Acute on chronic diastolic (congestive) heart failure: Secondary | ICD-10-CM | POA: Diagnosis not present

## 2021-07-12 DIAGNOSIS — N390 Urinary tract infection, site not specified: Secondary | ICD-10-CM | POA: Diagnosis not present

## 2021-07-12 DIAGNOSIS — R3981 Functional urinary incontinence: Secondary | ICD-10-CM | POA: Diagnosis not present

## 2021-07-13 DIAGNOSIS — N390 Urinary tract infection, site not specified: Secondary | ICD-10-CM | POA: Diagnosis not present

## 2021-07-14 DIAGNOSIS — I10 Essential (primary) hypertension: Secondary | ICD-10-CM | POA: Diagnosis not present

## 2021-07-14 DIAGNOSIS — F0280 Dementia in other diseases classified elsewhere without behavioral disturbance: Secondary | ICD-10-CM | POA: Diagnosis not present

## 2021-07-23 DIAGNOSIS — G301 Alzheimer's disease with late onset: Secondary | ICD-10-CM | POA: Diagnosis not present

## 2021-07-23 DIAGNOSIS — F332 Major depressive disorder, recurrent severe without psychotic features: Secondary | ICD-10-CM | POA: Diagnosis not present

## 2021-07-23 DIAGNOSIS — F03A3 Unspecified dementia, mild, with mood disturbance: Secondary | ICD-10-CM | POA: Diagnosis not present

## 2021-07-25 DIAGNOSIS — G309 Alzheimer's disease, unspecified: Secondary | ICD-10-CM | POA: Diagnosis not present

## 2021-07-25 DIAGNOSIS — E538 Deficiency of other specified B group vitamins: Secondary | ICD-10-CM | POA: Diagnosis not present

## 2021-07-25 DIAGNOSIS — K219 Gastro-esophageal reflux disease without esophagitis: Secondary | ICD-10-CM | POA: Diagnosis not present

## 2021-07-25 DIAGNOSIS — R531 Weakness: Secondary | ICD-10-CM | POA: Diagnosis not present

## 2021-07-25 DIAGNOSIS — I509 Heart failure, unspecified: Secondary | ICD-10-CM | POA: Diagnosis not present

## 2021-07-25 DIAGNOSIS — E785 Hyperlipidemia, unspecified: Secondary | ICD-10-CM | POA: Diagnosis not present

## 2021-07-25 DIAGNOSIS — I4891 Unspecified atrial fibrillation: Secondary | ICD-10-CM | POA: Diagnosis not present

## 2021-07-25 DIAGNOSIS — R3981 Functional urinary incontinence: Secondary | ICD-10-CM | POA: Diagnosis not present

## 2021-07-29 DIAGNOSIS — I5033 Acute on chronic diastolic (congestive) heart failure: Secondary | ICD-10-CM | POA: Diagnosis not present

## 2021-07-29 DIAGNOSIS — I110 Hypertensive heart disease with heart failure: Secondary | ICD-10-CM | POA: Diagnosis not present

## 2021-07-29 DIAGNOSIS — I083 Combined rheumatic disorders of mitral, aortic and tricuspid valves: Secondary | ICD-10-CM | POA: Diagnosis not present

## 2021-07-29 DIAGNOSIS — G309 Alzheimer's disease, unspecified: Secondary | ICD-10-CM | POA: Diagnosis not present

## 2021-07-29 DIAGNOSIS — J9601 Acute respiratory failure with hypoxia: Secondary | ICD-10-CM | POA: Diagnosis not present

## 2021-07-29 DIAGNOSIS — I4891 Unspecified atrial fibrillation: Secondary | ICD-10-CM | POA: Diagnosis not present

## 2021-07-29 DIAGNOSIS — Z9181 History of falling: Secondary | ICD-10-CM | POA: Diagnosis not present

## 2021-07-29 DIAGNOSIS — R3981 Functional urinary incontinence: Secondary | ICD-10-CM | POA: Diagnosis not present

## 2021-07-29 DIAGNOSIS — Z8616 Personal history of COVID-19: Secondary | ICD-10-CM | POA: Diagnosis not present

## 2021-07-29 DIAGNOSIS — Z87440 Personal history of urinary (tract) infections: Secondary | ICD-10-CM | POA: Diagnosis not present

## 2021-07-29 DIAGNOSIS — F0280 Dementia in other diseases classified elsewhere without behavioral disturbance: Secondary | ICD-10-CM | POA: Diagnosis not present

## 2021-07-29 DIAGNOSIS — G9389 Other specified disorders of brain: Secondary | ICD-10-CM | POA: Diagnosis not present

## 2021-08-01 DIAGNOSIS — G309 Alzheimer's disease, unspecified: Secondary | ICD-10-CM | POA: Diagnosis not present

## 2021-08-01 DIAGNOSIS — R3981 Functional urinary incontinence: Secondary | ICD-10-CM | POA: Diagnosis not present

## 2021-08-01 DIAGNOSIS — J9601 Acute respiratory failure with hypoxia: Secondary | ICD-10-CM | POA: Diagnosis not present

## 2021-08-01 DIAGNOSIS — Z87440 Personal history of urinary (tract) infections: Secondary | ICD-10-CM | POA: Diagnosis not present

## 2021-08-01 DIAGNOSIS — Z9181 History of falling: Secondary | ICD-10-CM | POA: Diagnosis not present

## 2021-08-01 DIAGNOSIS — I4891 Unspecified atrial fibrillation: Secondary | ICD-10-CM | POA: Diagnosis not present

## 2021-08-01 DIAGNOSIS — F0280 Dementia in other diseases classified elsewhere without behavioral disturbance: Secondary | ICD-10-CM | POA: Diagnosis not present

## 2021-08-01 DIAGNOSIS — G9389 Other specified disorders of brain: Secondary | ICD-10-CM | POA: Diagnosis not present

## 2021-08-01 DIAGNOSIS — I083 Combined rheumatic disorders of mitral, aortic and tricuspid valves: Secondary | ICD-10-CM | POA: Diagnosis not present

## 2021-08-01 DIAGNOSIS — I110 Hypertensive heart disease with heart failure: Secondary | ICD-10-CM | POA: Diagnosis not present

## 2021-08-01 DIAGNOSIS — I5033 Acute on chronic diastolic (congestive) heart failure: Secondary | ICD-10-CM | POA: Diagnosis not present

## 2021-08-01 DIAGNOSIS — Z8616 Personal history of COVID-19: Secondary | ICD-10-CM | POA: Diagnosis not present

## 2021-08-08 DIAGNOSIS — J9601 Acute respiratory failure with hypoxia: Secondary | ICD-10-CM | POA: Diagnosis not present

## 2021-08-08 DIAGNOSIS — G9389 Other specified disorders of brain: Secondary | ICD-10-CM | POA: Diagnosis not present

## 2021-08-08 DIAGNOSIS — E7800 Pure hypercholesterolemia, unspecified: Secondary | ICD-10-CM | POA: Diagnosis not present

## 2021-08-08 DIAGNOSIS — A419 Sepsis, unspecified organism: Secondary | ICD-10-CM | POA: Diagnosis not present

## 2021-08-08 DIAGNOSIS — Z8616 Personal history of COVID-19: Secondary | ICD-10-CM | POA: Diagnosis not present

## 2021-08-08 DIAGNOSIS — N390 Urinary tract infection, site not specified: Secondary | ICD-10-CM | POA: Diagnosis not present

## 2021-08-08 DIAGNOSIS — G309 Alzheimer's disease, unspecified: Secondary | ICD-10-CM | POA: Diagnosis not present

## 2021-08-08 DIAGNOSIS — Z87440 Personal history of urinary (tract) infections: Secondary | ICD-10-CM | POA: Diagnosis not present

## 2021-08-08 DIAGNOSIS — Z741 Need for assistance with personal care: Secondary | ICD-10-CM | POA: Diagnosis not present

## 2021-08-08 DIAGNOSIS — I4891 Unspecified atrial fibrillation: Secondary | ICD-10-CM | POA: Diagnosis not present

## 2021-08-08 DIAGNOSIS — F0280 Dementia in other diseases classified elsewhere without behavioral disturbance: Secondary | ICD-10-CM | POA: Diagnosis not present

## 2021-08-08 DIAGNOSIS — I110 Hypertensive heart disease with heart failure: Secondary | ICD-10-CM | POA: Diagnosis not present

## 2021-08-08 DIAGNOSIS — I083 Combined rheumatic disorders of mitral, aortic and tricuspid valves: Secondary | ICD-10-CM | POA: Diagnosis not present

## 2021-08-08 DIAGNOSIS — I480 Paroxysmal atrial fibrillation: Secondary | ICD-10-CM | POA: Diagnosis not present

## 2021-08-08 DIAGNOSIS — Z9181 History of falling: Secondary | ICD-10-CM | POA: Diagnosis not present

## 2021-08-08 DIAGNOSIS — I10 Essential (primary) hypertension: Secondary | ICD-10-CM | POA: Diagnosis not present

## 2021-08-08 DIAGNOSIS — R3981 Functional urinary incontinence: Secondary | ICD-10-CM | POA: Diagnosis not present

## 2021-08-08 DIAGNOSIS — I5033 Acute on chronic diastolic (congestive) heart failure: Secondary | ICD-10-CM | POA: Diagnosis not present

## 2021-08-09 DIAGNOSIS — I4581 Long QT syndrome: Secondary | ICD-10-CM | POA: Diagnosis not present

## 2021-08-09 DIAGNOSIS — I493 Ventricular premature depolarization: Secondary | ICD-10-CM | POA: Diagnosis not present

## 2021-08-15 DIAGNOSIS — J9601 Acute respiratory failure with hypoxia: Secondary | ICD-10-CM | POA: Diagnosis not present

## 2021-08-15 DIAGNOSIS — R3981 Functional urinary incontinence: Secondary | ICD-10-CM | POA: Diagnosis not present

## 2021-08-15 DIAGNOSIS — G9389 Other specified disorders of brain: Secondary | ICD-10-CM | POA: Diagnosis not present

## 2021-08-15 DIAGNOSIS — Z8616 Personal history of COVID-19: Secondary | ICD-10-CM | POA: Diagnosis not present

## 2021-08-15 DIAGNOSIS — Z87440 Personal history of urinary (tract) infections: Secondary | ICD-10-CM | POA: Diagnosis not present

## 2021-08-15 DIAGNOSIS — I083 Combined rheumatic disorders of mitral, aortic and tricuspid valves: Secondary | ICD-10-CM | POA: Diagnosis not present

## 2021-08-15 DIAGNOSIS — G309 Alzheimer's disease, unspecified: Secondary | ICD-10-CM | POA: Diagnosis not present

## 2021-08-15 DIAGNOSIS — F0280 Dementia in other diseases classified elsewhere without behavioral disturbance: Secondary | ICD-10-CM | POA: Diagnosis not present

## 2021-08-15 DIAGNOSIS — I110 Hypertensive heart disease with heart failure: Secondary | ICD-10-CM | POA: Diagnosis not present

## 2021-08-15 DIAGNOSIS — I5033 Acute on chronic diastolic (congestive) heart failure: Secondary | ICD-10-CM | POA: Diagnosis not present

## 2021-08-15 DIAGNOSIS — Z9181 History of falling: Secondary | ICD-10-CM | POA: Diagnosis not present

## 2021-08-15 DIAGNOSIS — I4891 Unspecified atrial fibrillation: Secondary | ICD-10-CM | POA: Diagnosis not present

## 2021-08-22 DIAGNOSIS — Z8616 Personal history of COVID-19: Secondary | ICD-10-CM | POA: Diagnosis not present

## 2021-08-22 DIAGNOSIS — Z87440 Personal history of urinary (tract) infections: Secondary | ICD-10-CM | POA: Diagnosis not present

## 2021-08-22 DIAGNOSIS — I5033 Acute on chronic diastolic (congestive) heart failure: Secondary | ICD-10-CM | POA: Diagnosis not present

## 2021-08-22 DIAGNOSIS — I4891 Unspecified atrial fibrillation: Secondary | ICD-10-CM | POA: Diagnosis not present

## 2021-08-22 DIAGNOSIS — G309 Alzheimer's disease, unspecified: Secondary | ICD-10-CM | POA: Diagnosis not present

## 2021-08-22 DIAGNOSIS — R3981 Functional urinary incontinence: Secondary | ICD-10-CM | POA: Diagnosis not present

## 2021-08-22 DIAGNOSIS — F0280 Dementia in other diseases classified elsewhere without behavioral disturbance: Secondary | ICD-10-CM | POA: Diagnosis not present

## 2021-08-22 DIAGNOSIS — I110 Hypertensive heart disease with heart failure: Secondary | ICD-10-CM | POA: Diagnosis not present

## 2021-08-22 DIAGNOSIS — G9389 Other specified disorders of brain: Secondary | ICD-10-CM | POA: Diagnosis not present

## 2021-08-22 DIAGNOSIS — Z9181 History of falling: Secondary | ICD-10-CM | POA: Diagnosis not present

## 2021-08-22 DIAGNOSIS — J9601 Acute respiratory failure with hypoxia: Secondary | ICD-10-CM | POA: Diagnosis not present

## 2021-08-22 DIAGNOSIS — I083 Combined rheumatic disorders of mitral, aortic and tricuspid valves: Secondary | ICD-10-CM | POA: Diagnosis not present

## 2021-08-27 DIAGNOSIS — J9601 Acute respiratory failure with hypoxia: Secondary | ICD-10-CM | POA: Diagnosis not present

## 2021-08-27 DIAGNOSIS — I083 Combined rheumatic disorders of mitral, aortic and tricuspid valves: Secondary | ICD-10-CM | POA: Diagnosis not present

## 2021-08-27 DIAGNOSIS — R3981 Functional urinary incontinence: Secondary | ICD-10-CM | POA: Diagnosis not present

## 2021-08-27 DIAGNOSIS — Z9181 History of falling: Secondary | ICD-10-CM | POA: Diagnosis not present

## 2021-08-27 DIAGNOSIS — G9389 Other specified disorders of brain: Secondary | ICD-10-CM | POA: Diagnosis not present

## 2021-08-27 DIAGNOSIS — I5033 Acute on chronic diastolic (congestive) heart failure: Secondary | ICD-10-CM | POA: Diagnosis not present

## 2021-08-27 DIAGNOSIS — F0280 Dementia in other diseases classified elsewhere without behavioral disturbance: Secondary | ICD-10-CM | POA: Diagnosis not present

## 2021-08-27 DIAGNOSIS — Z87440 Personal history of urinary (tract) infections: Secondary | ICD-10-CM | POA: Diagnosis not present

## 2021-08-27 DIAGNOSIS — G309 Alzheimer's disease, unspecified: Secondary | ICD-10-CM | POA: Diagnosis not present

## 2021-08-27 DIAGNOSIS — I4891 Unspecified atrial fibrillation: Secondary | ICD-10-CM | POA: Diagnosis not present

## 2021-08-27 DIAGNOSIS — Z8616 Personal history of COVID-19: Secondary | ICD-10-CM | POA: Diagnosis not present

## 2021-08-27 DIAGNOSIS — I110 Hypertensive heart disease with heart failure: Secondary | ICD-10-CM | POA: Diagnosis not present

## 2021-08-28 DIAGNOSIS — G309 Alzheimer's disease, unspecified: Secondary | ICD-10-CM | POA: Diagnosis not present

## 2021-08-28 DIAGNOSIS — I480 Paroxysmal atrial fibrillation: Secondary | ICD-10-CM | POA: Diagnosis not present

## 2021-08-28 DIAGNOSIS — I10 Essential (primary) hypertension: Secondary | ICD-10-CM | POA: Diagnosis not present

## 2021-08-28 DIAGNOSIS — I4891 Unspecified atrial fibrillation: Secondary | ICD-10-CM | POA: Diagnosis not present

## 2021-08-28 DIAGNOSIS — F0280 Dementia in other diseases classified elsewhere without behavioral disturbance: Secondary | ICD-10-CM | POA: Diagnosis not present

## 2021-08-28 DIAGNOSIS — I493 Ventricular premature depolarization: Secondary | ICD-10-CM | POA: Diagnosis not present

## 2021-08-28 DIAGNOSIS — E876 Hypokalemia: Secondary | ICD-10-CM | POA: Diagnosis not present

## 2021-08-28 DIAGNOSIS — E782 Mixed hyperlipidemia: Secondary | ICD-10-CM | POA: Diagnosis not present

## 2021-08-30 DIAGNOSIS — I4891 Unspecified atrial fibrillation: Secondary | ICD-10-CM | POA: Diagnosis not present

## 2021-08-30 DIAGNOSIS — I5033 Acute on chronic diastolic (congestive) heart failure: Secondary | ICD-10-CM | POA: Diagnosis not present

## 2021-08-30 DIAGNOSIS — Z87440 Personal history of urinary (tract) infections: Secondary | ICD-10-CM | POA: Diagnosis not present

## 2021-08-30 DIAGNOSIS — G9389 Other specified disorders of brain: Secondary | ICD-10-CM | POA: Diagnosis not present

## 2021-08-30 DIAGNOSIS — I110 Hypertensive heart disease with heart failure: Secondary | ICD-10-CM | POA: Diagnosis not present

## 2021-08-30 DIAGNOSIS — I083 Combined rheumatic disorders of mitral, aortic and tricuspid valves: Secondary | ICD-10-CM | POA: Diagnosis not present

## 2021-08-30 DIAGNOSIS — G309 Alzheimer's disease, unspecified: Secondary | ICD-10-CM | POA: Diagnosis not present

## 2021-08-30 DIAGNOSIS — F0280 Dementia in other diseases classified elsewhere without behavioral disturbance: Secondary | ICD-10-CM | POA: Diagnosis not present

## 2021-08-30 DIAGNOSIS — Z9181 History of falling: Secondary | ICD-10-CM | POA: Diagnosis not present

## 2021-08-30 DIAGNOSIS — R3981 Functional urinary incontinence: Secondary | ICD-10-CM | POA: Diagnosis not present

## 2021-08-30 DIAGNOSIS — Z8616 Personal history of COVID-19: Secondary | ICD-10-CM | POA: Diagnosis not present

## 2021-08-30 DIAGNOSIS — J9601 Acute respiratory failure with hypoxia: Secondary | ICD-10-CM | POA: Diagnosis not present

## 2021-09-04 DIAGNOSIS — F0280 Dementia in other diseases classified elsewhere without behavioral disturbance: Secondary | ICD-10-CM | POA: Diagnosis not present

## 2021-09-04 DIAGNOSIS — I083 Combined rheumatic disorders of mitral, aortic and tricuspid valves: Secondary | ICD-10-CM | POA: Diagnosis not present

## 2021-09-04 DIAGNOSIS — I5033 Acute on chronic diastolic (congestive) heart failure: Secondary | ICD-10-CM | POA: Diagnosis not present

## 2021-09-04 DIAGNOSIS — R3981 Functional urinary incontinence: Secondary | ICD-10-CM | POA: Diagnosis not present

## 2021-09-04 DIAGNOSIS — Z8616 Personal history of COVID-19: Secondary | ICD-10-CM | POA: Diagnosis not present

## 2021-09-04 DIAGNOSIS — G9389 Other specified disorders of brain: Secondary | ICD-10-CM | POA: Diagnosis not present

## 2021-09-04 DIAGNOSIS — J9601 Acute respiratory failure with hypoxia: Secondary | ICD-10-CM | POA: Diagnosis not present

## 2021-09-04 DIAGNOSIS — G309 Alzheimer's disease, unspecified: Secondary | ICD-10-CM | POA: Diagnosis not present

## 2021-09-04 DIAGNOSIS — I110 Hypertensive heart disease with heart failure: Secondary | ICD-10-CM | POA: Diagnosis not present

## 2021-09-04 DIAGNOSIS — I4891 Unspecified atrial fibrillation: Secondary | ICD-10-CM | POA: Diagnosis not present

## 2021-09-04 DIAGNOSIS — Z9181 History of falling: Secondary | ICD-10-CM | POA: Diagnosis not present

## 2021-09-04 DIAGNOSIS — Z87440 Personal history of urinary (tract) infections: Secondary | ICD-10-CM | POA: Diagnosis not present

## 2021-09-13 DIAGNOSIS — F0280 Dementia in other diseases classified elsewhere without behavioral disturbance: Secondary | ICD-10-CM | POA: Diagnosis not present

## 2021-09-13 DIAGNOSIS — G309 Alzheimer's disease, unspecified: Secondary | ICD-10-CM | POA: Diagnosis not present

## 2021-09-13 DIAGNOSIS — Z9181 History of falling: Secondary | ICD-10-CM | POA: Diagnosis not present

## 2021-09-13 DIAGNOSIS — G9389 Other specified disorders of brain: Secondary | ICD-10-CM | POA: Diagnosis not present

## 2021-09-13 DIAGNOSIS — I5033 Acute on chronic diastolic (congestive) heart failure: Secondary | ICD-10-CM | POA: Diagnosis not present

## 2021-09-13 DIAGNOSIS — J9601 Acute respiratory failure with hypoxia: Secondary | ICD-10-CM | POA: Diagnosis not present

## 2021-09-13 DIAGNOSIS — I083 Combined rheumatic disorders of mitral, aortic and tricuspid valves: Secondary | ICD-10-CM | POA: Diagnosis not present

## 2021-09-13 DIAGNOSIS — R3981 Functional urinary incontinence: Secondary | ICD-10-CM | POA: Diagnosis not present

## 2021-09-13 DIAGNOSIS — I4891 Unspecified atrial fibrillation: Secondary | ICD-10-CM | POA: Diagnosis not present

## 2021-09-13 DIAGNOSIS — I110 Hypertensive heart disease with heart failure: Secondary | ICD-10-CM | POA: Diagnosis not present

## 2021-09-13 DIAGNOSIS — Z8616 Personal history of COVID-19: Secondary | ICD-10-CM | POA: Diagnosis not present

## 2021-09-13 DIAGNOSIS — Z87440 Personal history of urinary (tract) infections: Secondary | ICD-10-CM | POA: Diagnosis not present

## 2021-09-21 DIAGNOSIS — J9601 Acute respiratory failure with hypoxia: Secondary | ICD-10-CM | POA: Diagnosis not present

## 2021-09-21 DIAGNOSIS — I5033 Acute on chronic diastolic (congestive) heart failure: Secondary | ICD-10-CM | POA: Diagnosis not present

## 2021-09-21 DIAGNOSIS — F0280 Dementia in other diseases classified elsewhere without behavioral disturbance: Secondary | ICD-10-CM | POA: Diagnosis not present

## 2021-09-21 DIAGNOSIS — Z9181 History of falling: Secondary | ICD-10-CM | POA: Diagnosis not present

## 2021-09-21 DIAGNOSIS — I083 Combined rheumatic disorders of mitral, aortic and tricuspid valves: Secondary | ICD-10-CM | POA: Diagnosis not present

## 2021-09-21 DIAGNOSIS — I110 Hypertensive heart disease with heart failure: Secondary | ICD-10-CM | POA: Diagnosis not present

## 2021-09-21 DIAGNOSIS — G309 Alzheimer's disease, unspecified: Secondary | ICD-10-CM | POA: Diagnosis not present

## 2021-09-21 DIAGNOSIS — Z87440 Personal history of urinary (tract) infections: Secondary | ICD-10-CM | POA: Diagnosis not present

## 2021-09-21 DIAGNOSIS — Z8616 Personal history of COVID-19: Secondary | ICD-10-CM | POA: Diagnosis not present

## 2021-09-21 DIAGNOSIS — G9389 Other specified disorders of brain: Secondary | ICD-10-CM | POA: Diagnosis not present

## 2021-09-21 DIAGNOSIS — I4891 Unspecified atrial fibrillation: Secondary | ICD-10-CM | POA: Diagnosis not present

## 2021-09-21 DIAGNOSIS — R3981 Functional urinary incontinence: Secondary | ICD-10-CM | POA: Diagnosis not present

## 2021-09-24 DIAGNOSIS — Z9181 History of falling: Secondary | ICD-10-CM | POA: Diagnosis not present

## 2021-09-24 DIAGNOSIS — G309 Alzheimer's disease, unspecified: Secondary | ICD-10-CM | POA: Diagnosis not present

## 2021-09-24 DIAGNOSIS — J9601 Acute respiratory failure with hypoxia: Secondary | ICD-10-CM | POA: Diagnosis not present

## 2021-09-24 DIAGNOSIS — R3981 Functional urinary incontinence: Secondary | ICD-10-CM | POA: Diagnosis not present

## 2021-09-24 DIAGNOSIS — G9389 Other specified disorders of brain: Secondary | ICD-10-CM | POA: Diagnosis not present

## 2021-09-24 DIAGNOSIS — I110 Hypertensive heart disease with heart failure: Secondary | ICD-10-CM | POA: Diagnosis not present

## 2021-09-24 DIAGNOSIS — I5033 Acute on chronic diastolic (congestive) heart failure: Secondary | ICD-10-CM | POA: Diagnosis not present

## 2021-09-24 DIAGNOSIS — Z8616 Personal history of COVID-19: Secondary | ICD-10-CM | POA: Diagnosis not present

## 2021-09-24 DIAGNOSIS — I4891 Unspecified atrial fibrillation: Secondary | ICD-10-CM | POA: Diagnosis not present

## 2021-09-24 DIAGNOSIS — I083 Combined rheumatic disorders of mitral, aortic and tricuspid valves: Secondary | ICD-10-CM | POA: Diagnosis not present

## 2021-09-24 DIAGNOSIS — Z87440 Personal history of urinary (tract) infections: Secondary | ICD-10-CM | POA: Diagnosis not present

## 2021-09-24 DIAGNOSIS — F0280 Dementia in other diseases classified elsewhere without behavioral disturbance: Secondary | ICD-10-CM | POA: Diagnosis not present

## 2021-10-11 DIAGNOSIS — G309 Alzheimer's disease, unspecified: Secondary | ICD-10-CM | POA: Diagnosis not present

## 2021-10-11 DIAGNOSIS — F0280 Dementia in other diseases classified elsewhere without behavioral disturbance: Secondary | ICD-10-CM | POA: Diagnosis not present

## 2021-10-11 DIAGNOSIS — I480 Paroxysmal atrial fibrillation: Secondary | ICD-10-CM | POA: Diagnosis not present

## 2021-10-11 DIAGNOSIS — I10 Essential (primary) hypertension: Secondary | ICD-10-CM | POA: Diagnosis not present

## 2021-10-11 DIAGNOSIS — E782 Mixed hyperlipidemia: Secondary | ICD-10-CM | POA: Diagnosis not present

## 2022-04-16 DIAGNOSIS — M205X1 Other deformities of toe(s) (acquired), right foot: Secondary | ICD-10-CM | POA: Diagnosis not present

## 2022-04-16 DIAGNOSIS — L600 Ingrowing nail: Secondary | ICD-10-CM | POA: Diagnosis not present

## 2022-05-29 ENCOUNTER — Ambulatory Visit: Payer: Self-pay | Admitting: Family

## 2022-08-07 DIAGNOSIS — I10 Essential (primary) hypertension: Secondary | ICD-10-CM | POA: Diagnosis not present

## 2022-08-07 DIAGNOSIS — E782 Mixed hyperlipidemia: Secondary | ICD-10-CM | POA: Diagnosis not present

## 2022-08-07 DIAGNOSIS — I480 Paroxysmal atrial fibrillation: Secondary | ICD-10-CM | POA: Diagnosis not present

## 2022-08-08 ENCOUNTER — Encounter: Payer: Self-pay | Admitting: Family

## 2022-08-08 ENCOUNTER — Ambulatory Visit (INDEPENDENT_AMBULATORY_CARE_PROVIDER_SITE_OTHER): Payer: Medicare HMO | Admitting: Family

## 2022-08-08 VITALS — BP 124/76 | HR 68 | Temp 97.9°F | Ht 60.0 in | Wt 156.8 lb

## 2022-08-08 DIAGNOSIS — K921 Melena: Secondary | ICD-10-CM | POA: Insufficient documentation

## 2022-08-08 DIAGNOSIS — R3915 Urgency of urination: Secondary | ICD-10-CM | POA: Insufficient documentation

## 2022-08-08 DIAGNOSIS — G309 Alzheimer's disease, unspecified: Secondary | ICD-10-CM

## 2022-08-08 DIAGNOSIS — I482 Chronic atrial fibrillation, unspecified: Secondary | ICD-10-CM | POA: Insufficient documentation

## 2022-08-08 DIAGNOSIS — I509 Heart failure, unspecified: Secondary | ICD-10-CM | POA: Insufficient documentation

## 2022-08-08 DIAGNOSIS — I10 Essential (primary) hypertension: Secondary | ICD-10-CM | POA: Insufficient documentation

## 2022-08-08 DIAGNOSIS — F0280 Dementia in other diseases classified elsewhere without behavioral disturbance: Secondary | ICD-10-CM | POA: Insufficient documentation

## 2022-08-08 DIAGNOSIS — K644 Residual hemorrhoidal skin tags: Secondary | ICD-10-CM | POA: Insufficient documentation

## 2022-08-08 DIAGNOSIS — K529 Noninfective gastroenteritis and colitis, unspecified: Secondary | ICD-10-CM

## 2022-08-08 LAB — COMPREHENSIVE METABOLIC PANEL
Creatinine, Ser: 0.68 mg/dL (ref 0.40–1.20)
Total Protein: 7 g/dL (ref 6.0–8.3)

## 2022-08-08 MED ORDER — HYDROCORTISONE (PERIANAL) 1 % EX CREA
1.0000 | TOPICAL_CREAM | Freq: Four times a day (QID) | CUTANEOUS | 1 refills | Status: DC
Start: 2022-08-08 — End: 2022-09-02

## 2022-08-23 ENCOUNTER — Encounter: Payer: Self-pay | Admitting: Physician Assistant

## 2022-09-02 ENCOUNTER — Other Ambulatory Visit: Payer: Self-pay | Admitting: Family

## 2022-09-02 DIAGNOSIS — I482 Chronic atrial fibrillation, unspecified: Secondary | ICD-10-CM

## 2022-09-02 DIAGNOSIS — I509 Heart failure, unspecified: Secondary | ICD-10-CM

## 2022-09-02 DIAGNOSIS — K921 Melena: Secondary | ICD-10-CM

## 2022-09-02 DIAGNOSIS — I10 Essential (primary) hypertension: Secondary | ICD-10-CM

## 2022-09-03 MED ORDER — HYDROCORTISONE (PERIANAL) 1 % EX CREA
1.0000 | TOPICAL_CREAM | Freq: Four times a day (QID) | CUTANEOUS | 1 refills | Status: DC
Start: 2022-09-03 — End: 2023-09-18

## 2022-09-03 MED ORDER — CARVEDILOL 6.25 MG PO TABS: 6.2500 mg | ORAL_TABLET | Freq: Two times a day (BID) | ORAL | 3 refills | Status: DC

## 2022-09-03 MED ORDER — XARELTO 20 MG PO TABS
20.0000 mg | ORAL_TABLET | Freq: Every day | ORAL | 3 refills | Status: DC
Start: 2022-09-03 — End: 2023-09-04

## 2022-09-03 MED ORDER — DILTIAZEM HCL ER COATED BEADS 180 MG PO CP24: 180.0000 mg | ORAL_CAPSULE | Freq: Every day | ORAL | 3 refills | Status: DC

## 2022-09-13 ENCOUNTER — Encounter: Payer: Self-pay | Admitting: Physician Assistant

## 2022-09-20 ENCOUNTER — Encounter: Payer: Self-pay | Admitting: Physician Assistant

## 2022-10-20 IMAGING — CT CT HEAD W/O CM
4 series · 17 of 47 positions shown, 19 images · non-contrast
Comparison: Head CT dated December 06, 2006

CLINICAL DATA: Head trauma



[Series 2: head wo · axial · 0.44mm/px · z∈[-552,-432]mm · 7 of 33 slices shown, 9 images]
[im 5/33  brain]
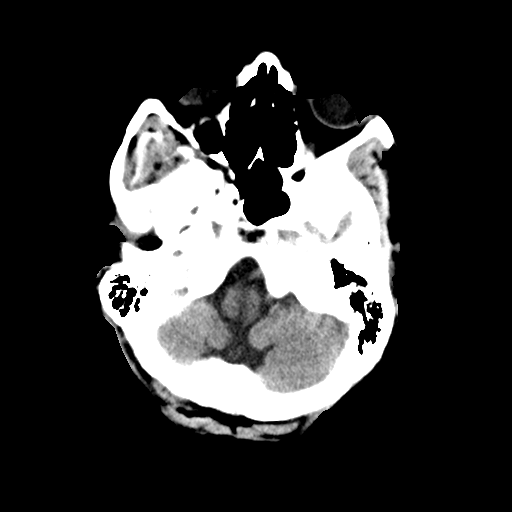
[im 5/33  bone]
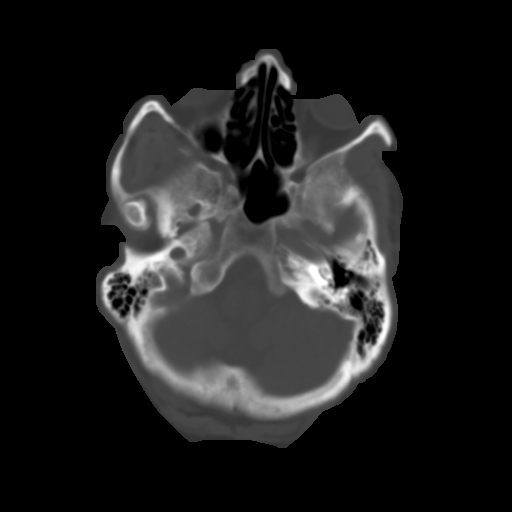
[im 9/33  brain]
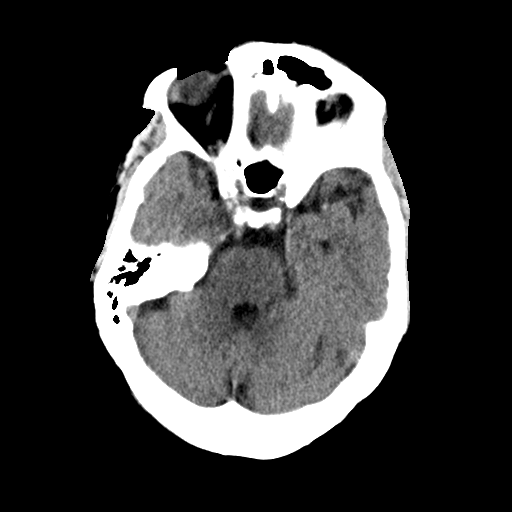
[im 13/33  brain]
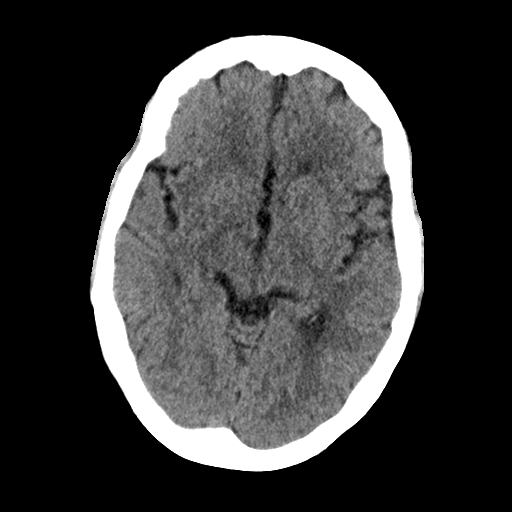
[im 17/33  brain]
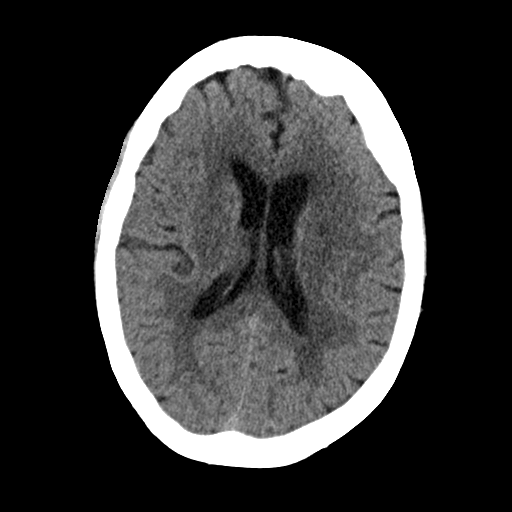
[im 21/33  brain]
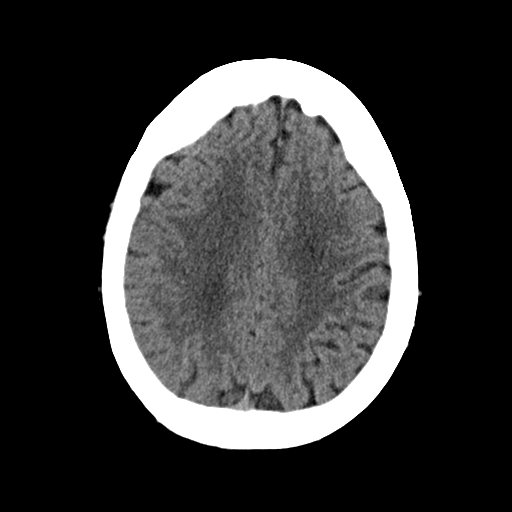
[im 21/33  bone]
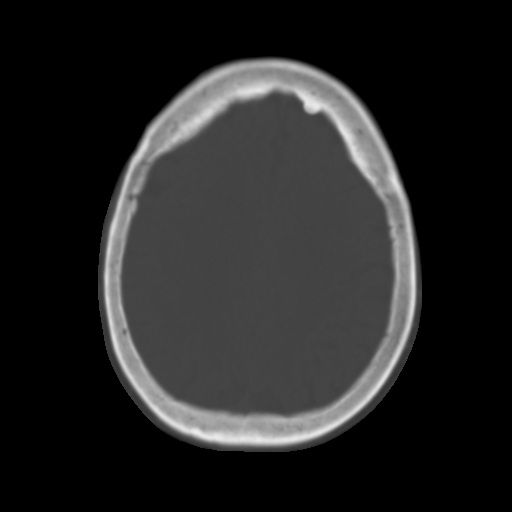
[im 25/33  brain]
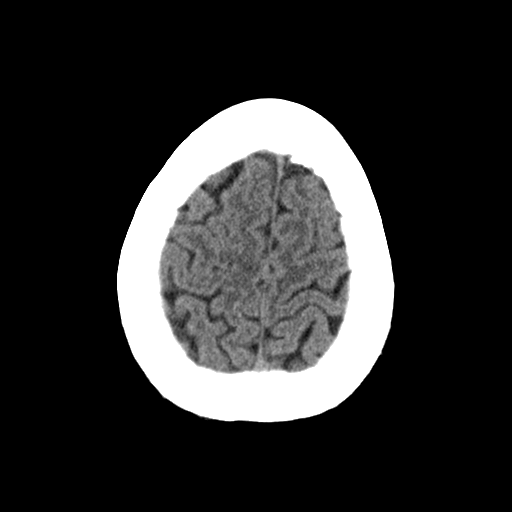
[im 29/33  brain]
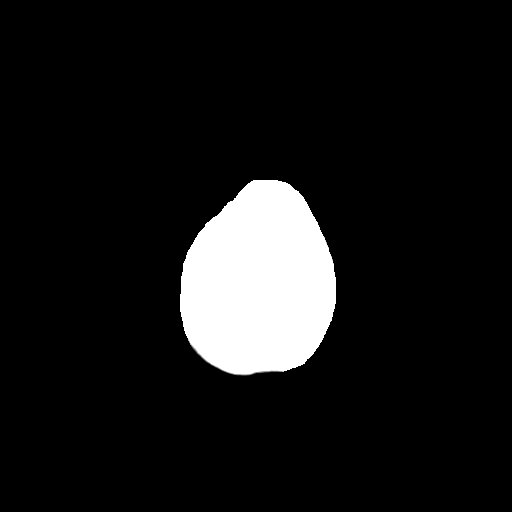

[Series 3: head bone · axial · 0.44mm/px · z∈[-556,-500]mm · 4 of 81 slices shown]
[im 9/81  bone]
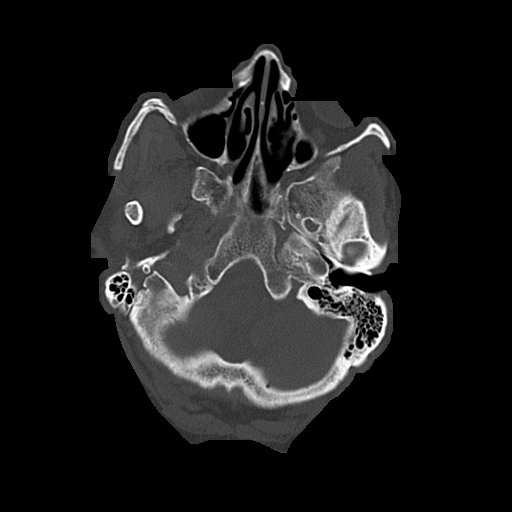
[im 17/81  bone]
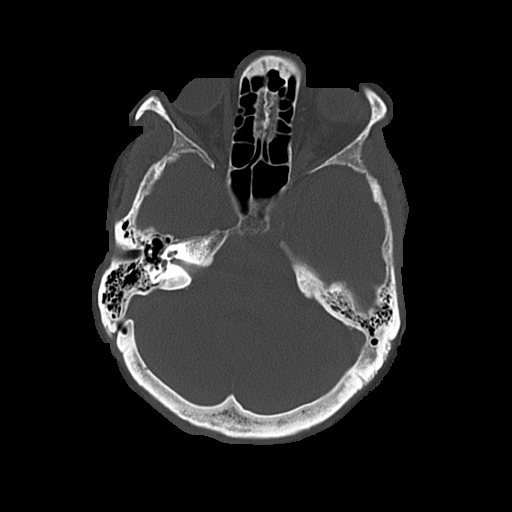
[im 25/81  bone]
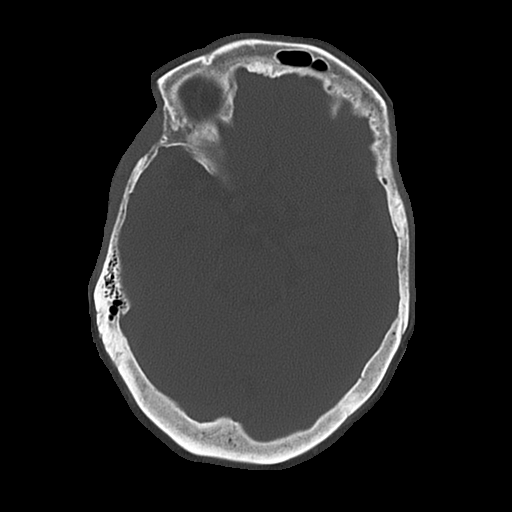
[im 37/81  bone]
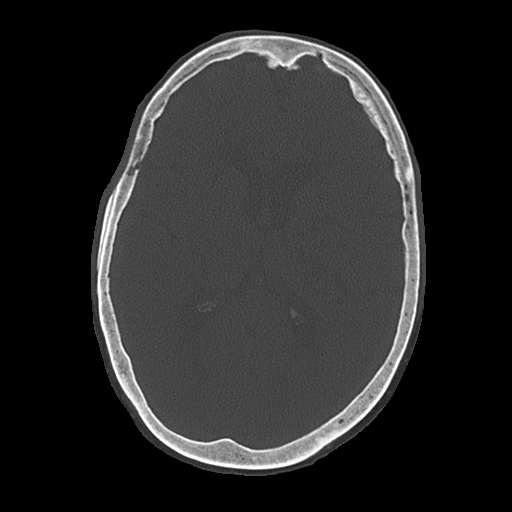

[Series 4: coronal soft · coronal · 0.33mm/px · 3 of 69 slices shown]
[im 23/69  brain]
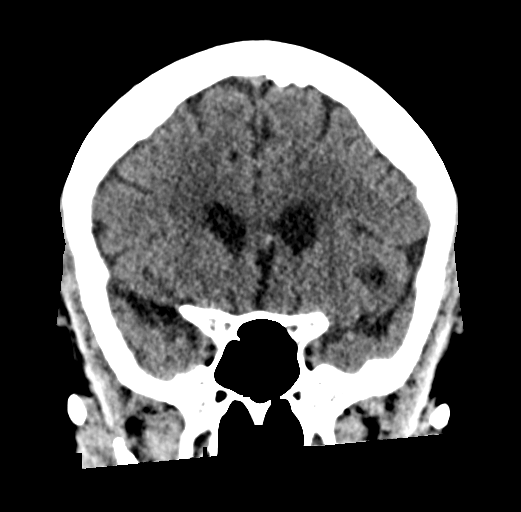
[im 31/69  brain]
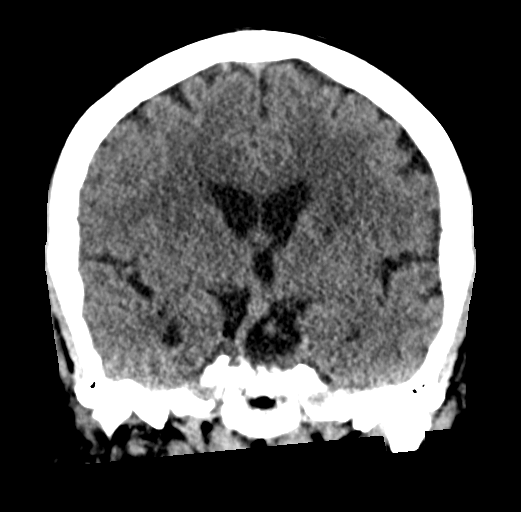
[im 38/69  brain]
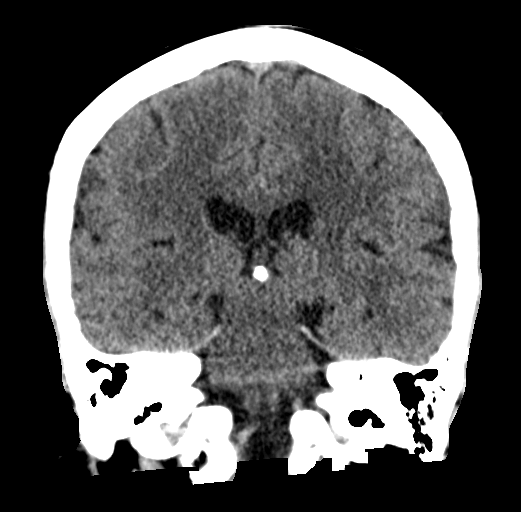

[Series 5: sagittal soft · sagittal · 0.33mm/px · 3 of 59 slices shown]
[im 20/59  brain]
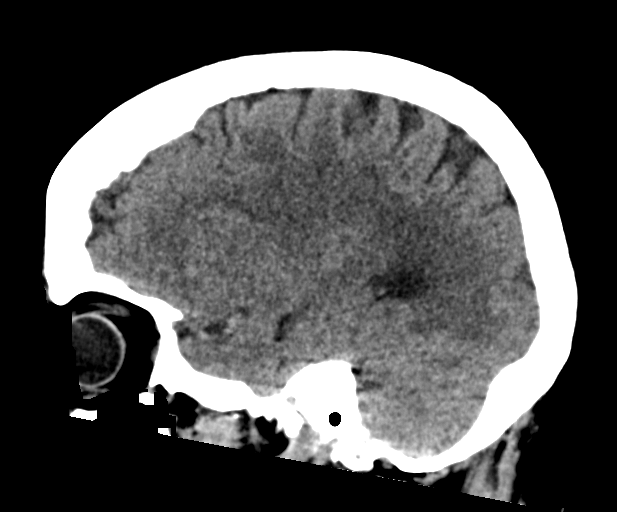
[im 30/59  brain]
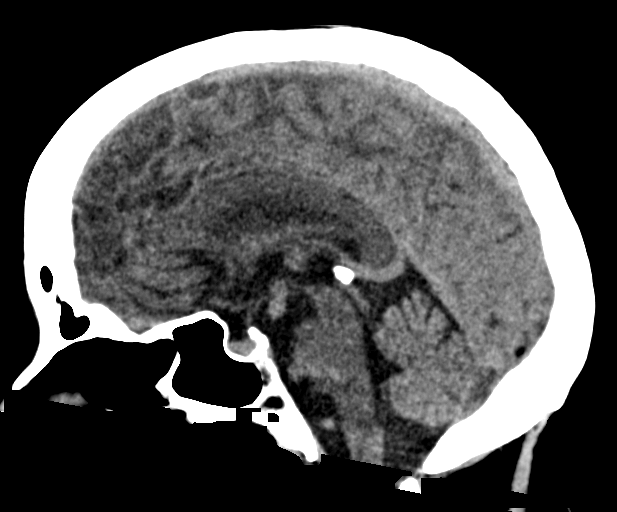
[im 39/59  brain]
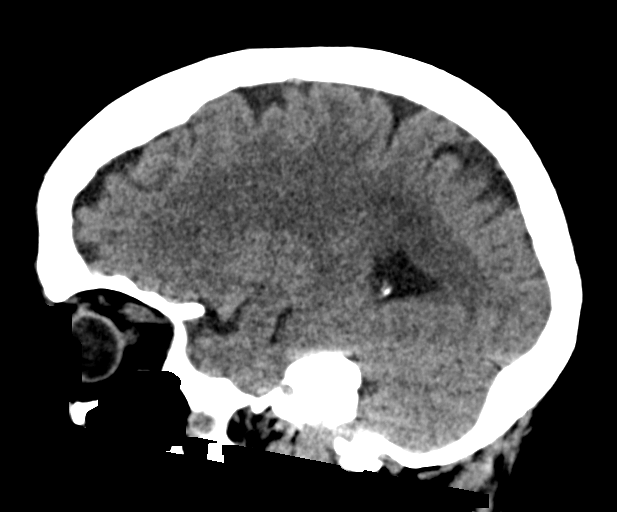

[17 of 47 positions shown; findings below may reference images not displayed]

FINDINGS: Brain: Punctate right basal ganglia calcification. No evidence of
acute infarction, hemorrhage, hydrocephalus, extra-axial collection
or mass lesion/mass effect.

Vascular: No hyperdense vessel or unexpected calcification.

Skull: Normal. Negative for fracture or focal lesion.

Sinuses/Orbits: No acute finding.

Other: None.
IMPRESSION: No acute intracranial abnormality.

## 2022-10-22 ENCOUNTER — Ambulatory Visit (INDEPENDENT_AMBULATORY_CARE_PROVIDER_SITE_OTHER): Payer: Medicare HMO

## 2022-10-22 VITALS — Ht 61.0 in | Wt 156.0 lb

## 2022-10-22 DIAGNOSIS — H9193 Unspecified hearing loss, bilateral: Secondary | ICD-10-CM | POA: Diagnosis not present

## 2022-10-22 DIAGNOSIS — Z0000 Encounter for general adult medical examination without abnormal findings: Secondary | ICD-10-CM

## 2022-10-22 DIAGNOSIS — Z780 Asymptomatic menopausal state: Secondary | ICD-10-CM

## 2022-10-22 DIAGNOSIS — Z1159 Encounter for screening for other viral diseases: Secondary | ICD-10-CM | POA: Diagnosis not present

## 2022-10-22 IMAGING — DX DG CHEST 1V PORT
1 series · 1 of 1 positions shown · non-contrast
Comparison: 04/27/2020

CLINICAL DATA: Shortness of breath.

EXAM:
PORTABLE CHEST 1 VIEW

[chest]
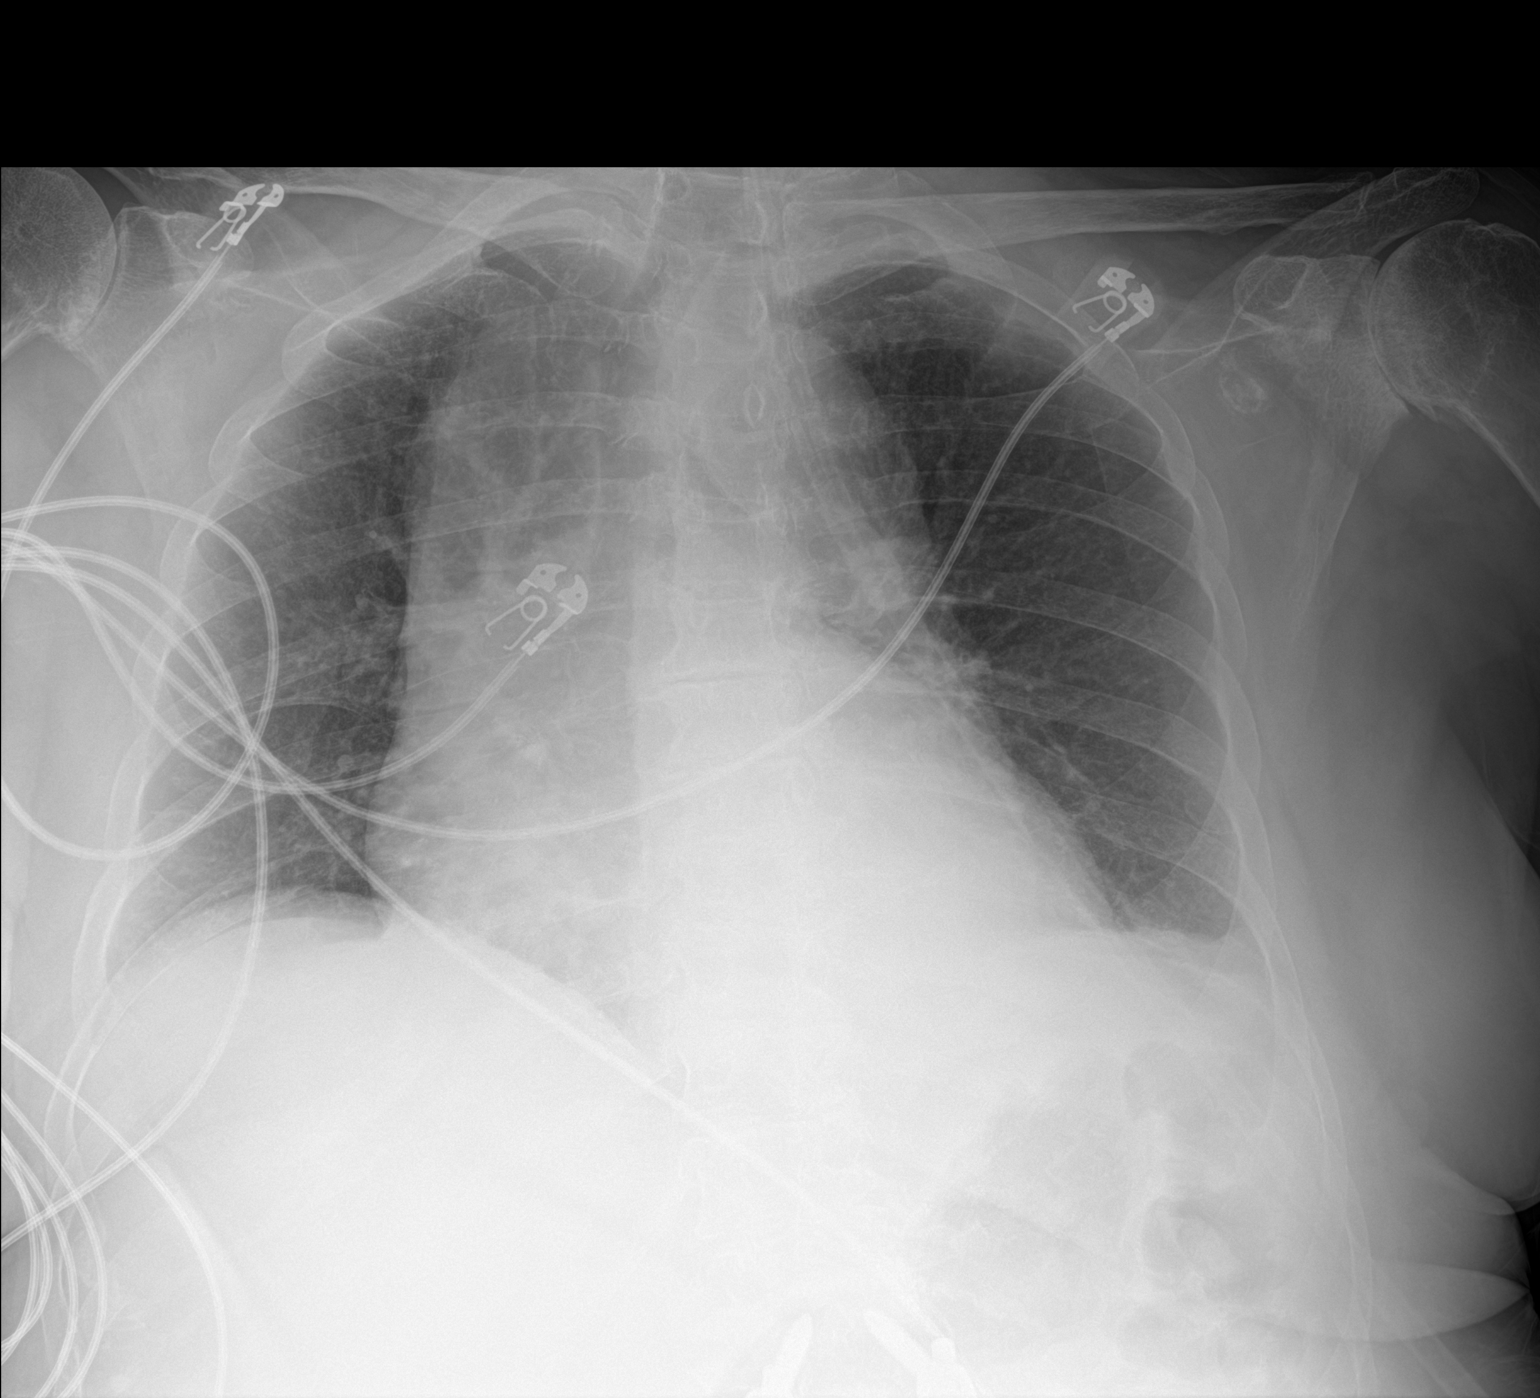

[1 of 1 positions shown; findings below may reference images not displayed]

FINDINGS: 6811 hours. The cardio pericardial silhouette is enlarged. The lungs
are clear without focal pneumonia, edema, pneumothorax or pleural
effusion. Interstitial markings are diffusely coarsened with chronic
features. Bones are diffusely demineralized. Telemetry leads overlie
the chest.
IMPRESSION: No acute cardiopulmonary findings.

## 2022-10-22 NOTE — Progress Notes (Signed)
Because this visit was a virtual/telehealth visit,  certain criteria was not obtained, such a blood pressure, CBG if patient is a diabetic, and timed up and go. Any medications not marked as "taking" was not mentioned during the medication reconciliation part of the visit. Any vitals not documented were not able to be obtained due to this being a telehealth visit. Vitals documented are verbally provided by the patient.  Per patient no change in vitals since last visit, unable to obtain new vitals due to telehealth visit.   Visit completed with the help of patients Daughter In [place].  Subjective:   Christie Sanders is a 77 y.o. female who presents for an Initial Medicare Annual Wellness Visit.  Visit Complete: Virtual  I connected with  Luisa Hart on 10/22/22 by a audio enabled telemedicine application and verified that I am speaking with the correct person using two identifiers.  Patient Location: Home  Provider Location: Home Office  I discussed the limitations of evaluation and management by telemedicine. The patient expressed understanding and agreed to proceed.  Patient Medicare AWV questionnaire was completed by the patient on n/a; I have confirmed that all information answered by patient is correct and no changes since this date.  Review of Systems     Cardiac Risk Factors include: advanced age (>33men, >53 women);dyslipidemia;hypertension     Objective:    Today's Vitals   10/22/22 1450 10/22/22 1451  Weight: 156 lb (70.8 kg)   Height: 5\' 1"  (1.549 m)   PainSc:  5    Body mass index is 29.48 kg/m.     10/22/2022    2:50 PM  Advanced Directives  Does Patient Have a Medical Advance Directive? No  Would patient like information on creating a medical advance directive? No - Patient declined    Current Medications (verified) Outpatient Encounter Medications as of 10/22/2022  Medication Sig   carvedilol (COREG) 6.25 MG tablet Take 1 tablet (6.25 mg total) by mouth 2 (two)  times daily.   Cholecalciferol (VITAMIN D-3 PO) Take by mouth.   diltiazem (CARDIZEM CD) 180 MG 24 hr capsule Take 1 capsule (180 mg total) by mouth daily.   Hydrocortisone, Perianal, (PROCTOCORT) 1 % CREA Apply 1 Application topically in the morning, at noon, in the evening, and at bedtime. Apply after bowel movement   Multiple Vitamin (MULTIVITAMIN) capsule Take 1 capsule by mouth daily.   potassium chloride (KLOR-CON) 10 MEQ tablet Take by mouth.   XARELTO 20 MG TABS tablet Take 1 tablet (20 mg total) by mouth daily.   No facility-administered encounter medications on file as of 10/22/2022.    Allergies (verified) Patient has no known allergies.   History: History reviewed. No pertinent past medical history. Past Surgical History:  Procedure Laterality Date   left knee surgery  Left    replacement   LUMBAR FUSION     Family History  Problem Relation Age of Onset   Hypertension Mother    Brain cancer Mother    Alcoholism Father    Aneurysm Father    COPD Sister    Lung cancer Sister        smoker   Atrial fibrillation Sister    Heart disease Sister    Lung cancer Daughter    COPD Daughter    Social History   Socioeconomic History   Marital status: Widowed    Spouse name: Not on file   Number of children: Not on file   Years of education: Not on file  Highest education level: Not on file  Occupational History   Occupation: retired  Tobacco Use   Smoking status: Never   Smokeless tobacco: Never  Vaping Use   Vaping status: Never Used  Substance and Sexual Activity   Alcohol use: Never   Drug use: Never   Sexual activity: Not Currently    Partners: Male  Other Topics Concern   Not on file  Social History Narrative   Lives at home by herself, lives close with her son and DIL which they help her with transportation    Social Determinants of Health   Financial Resource Strain: Low Risk  (10/22/2022)   Overall Financial Resource Strain (CARDIA)    Difficulty  of Paying Living Expenses: Not hard at all  Food Insecurity: No Food Insecurity (10/22/2022)   Hunger Vital Sign    Worried About Running Out of Food in the Last Year: Never true    Ran Out of Food in the Last Year: Never true  Transportation Needs: No Transportation Needs (10/22/2022)   PRAPARE - Administrator, Civil Service (Medical): No    Lack of Transportation (Non-Medical): No  Physical Activity: Sufficiently Active (10/22/2022)   Exercise Vital Sign    Days of Exercise per Week: 7 days    Minutes of Exercise per Session: 30 min  Stress: No Stress Concern Present (10/22/2022)   [employer] of Occupational Health - Occupational Stress Questionnaire    Feeling of Stress : Not at all  Social Connections: Moderately Integrated (10/22/2022)   Social Connection and Isolation Panel [NHANES]    Frequency of Communication with Friends and Family: More than three times a week    Frequency of Social Gatherings with Friends and Family: More than three times a week    Attends Religious Services: More than 4 times per year    Active Member of [employer] or Organizations: Yes    Attends Banker Meetings: More than 4 times per year    Marital Status: Widowed    Tobacco Counseling Counseling given: Yes   Clinical Intake:  Pre-visit preparation completed: Yes  Pain : 0-10 Pain Score: 5  Pain Type: Chronic pain Pain Location: Shoulder Pain Orientation: Right, Left Pain Descriptors / Indicators: Aching, Throbbing Pain Onset: More than a month ago Pain Frequency: Constant     BMI - recorded: 29.48 Nutritional Status: BMI 25 -29 Overweight Nutritional Risks: None Diabetes: No            Activities of Daily Living    10/22/2022    3:09 PM  In your present state of health, do you have any difficulty performing the following activities:  Hearing? 0  Vision? 0  Difficulty concentrating or making decisions? 0  Walking or climbing stairs? 1  Comment  uses a walker  Dressing or bathing? 1  Comment due to chronic shoulder pain. Her DIL helps her when the pain is too bad and she isn't able to get certain types of clothing on.  Doing errands, shopping? 1  Comment Patient has family that drives her where she needs to go  Preparing Food and eating ? N  Using the Toilet? N  In the past six months, have you accidently leaked urine? Y  Do you have problems with loss of bowel control? Y  Managing your Medications? Y  Comment DIL manages medications for her.  Managing your Finances? N  Housekeeping or managing your Housekeeping? N    Patient Care Team: Mort Sawyers,  FNP as PCP - General (Family Medicine)  Indicate any recent Medical Services you may have received from other than [facility] providers in the past year (date may be approximate).     Assessment:   This is a routine wellness examination for Christie Sanders.  Hearing/Vision screen Hearing Screening - Comments:: Patient states she doesn't hear as well as she used to. Referral placed for testing  Dietary issues and exercise activities discussed:     Goals Addressed             This Visit's Progress    Patient Stated       Maintain my independence.        Depression Screen    10/22/2022    3:01 PM  PHQ 2/9 Scores  PHQ - 2 Score 1    Fall Risk    10/22/2022    3:08 PM  Fall Risk   Falls in the past year? 0  Number falls in past yr: 0  Injury with Fall? 0  Risk for fall due to : Impaired mobility;Impaired balance/gait  Follow up Falls prevention discussed;Education provided    MEDICARE RISK AT HOME:  Medicare Risk at Home - 10/22/22 1506     Any stairs in or around the home? No    If so, are there any without handrails? No    Home free of loose throw rugs in walkways, pet beds, electrical cords, etc? Yes    Adequate lighting in your home to reduce risk of falls? Yes    Life alert? No    Use of a cane, walker or w/c? Yes    Grab bars in the bathroom? Yes     Shower chair or bench in shower? Yes    Elevated toilet seat or a handicapped toilet? Yes             TIMED UP AND GO:  Was the test performed? No    Cognitive Function:        10/22/2022    2:56 PM  6CIT Screen  What Year? 0 points  What month? 0 points  What time? 0 points  Count back from 20 0 points  Months in reverse 0 points  Repeat phrase 0 points  Total Score 0 points    Immunizations  There is no immunization history on file for this patient.  TDAP status: Due, Education has been provided regarding the importance of this vaccine. Advised may receive this vaccine at local pharmacy or Health Dept. Aware to provide a copy of the vaccination record if obtained from local pharmacy or Health Dept. Verbalized acceptance and understanding.  Flu Vaccine status: Up to date  Pneumococcal vaccine status: Due, Education has been provided regarding the importance of this vaccine. Advised may receive this vaccine at local pharmacy or Health Dept. Aware to provide a copy of the vaccination record if obtained from local pharmacy or Health Dept. Verbalized acceptance and understanding.  Covid-19 vaccine status: Information provided on how to obtain vaccines.   Qualifies for Shingles Vaccine? Yes   Zostavax completed Yes   Shingrix Completed?: No.    Education has been provided regarding the importance of this vaccine. Patient has been advised to call insurance company to determine out of pocket expense if they have not yet received this vaccine. Advised may also receive vaccine at local pharmacy or Health Dept. Verbalized acceptance and understanding.  Screening Tests Health Maintenance  Topic Date Due   COVID-19 Vaccine (1) Never done  Hepatitis C Screening  Never done   DTaP/Tdap/Td (1 - Tdap) Never done   Zoster Vaccines- Shingrix (1 of 2) Never done   Pneumonia Vaccine 29+ Years old (1 of 1 - PCV) Never done   DEXA SCAN  Never done   INFLUENZA VACCINE  10/24/2022    Medicare Annual Wellness (AWV)  10/22/2023   HPV VACCINES  Aged Out    Health Maintenance  Health Maintenance Due  Topic Date Due   COVID-19 Vaccine (1) Never done   Hepatitis C Screening  Never done   DTaP/Tdap/Td (1 - Tdap) Never done   Zoster Vaccines- Shingrix (1 of 2) Never done   Pneumonia Vaccine 68+ Years old (1 of 1 - PCV) Never done   DEXA SCAN  Never done    Colorectal Cancer Screenings: Patient declined   Mammogram Status: Patient declined  Bone Density status: Ordered 10/22/2022. Pt provided with contact info and advised to call to schedule appt.  Lung Cancer Screening: (Low Dose CT Chest recommended if Age 46-80 years, 20 pack-year currently smoking OR have quit w/in 15years.) does not qualify.   Additional Screening:  Hepatitis C Screening: does qualify;Ordered 10/22/2022  Vision Screening: Recommended annual ophthalmology exams for early detection of glaucoma and other disorders of the eye. Is the patient up to date with their annual eye exam?  Yes  Who is the provider or what is the name of the office in which the patient attends annual eye exams? Doesn't recall If pt is not established with a provider, would they like to be referred to a provider to establish care? No .   Dental Screening: Recommended annual dental exams for proper oral hygiene  Diabetic Foot Exam: n/a  Community Resource Referral / Chronic Care Management: CRR required this visit?  No   CCM required this visit?  No     Plan:     I have personally reviewed and noted the following in the patient's chart:   Medical and social history Use of alcohol, tobacco or illicit drugs  Current medications and supplements including opioid prescriptions. Patient is not currently taking opioid prescriptions. Functional ability and status Nutritional status Physical activity Advanced directives List of other physicians Hospitalizations, surgeries, and ER visits in previous 12  months Vitals Screenings to include cognitive, depression, and falls Referrals and appointments  In addition, I have reviewed and discussed with patient certain preventive protocols, quality metrics, and best practice recommendations. A written personalized care plan for preventive services as well as general preventive health recommendations were provided to patient.     Jordan Hawks , CMA   10/22/2022   After Visit Summary: (Mail) Due to this being a telephonic visit, the after visit summary with patients personalized plan was offered to patient via mail   Nurse Notes: Dexa Scan and Hep C screening ordered for this patient today.

## 2022-10-31 ENCOUNTER — Other Ambulatory Visit: Payer: Self-pay | Admitting: Family

## 2022-10-31 DIAGNOSIS — H9193 Unspecified hearing loss, bilateral: Secondary | ICD-10-CM

## 2022-11-07 ENCOUNTER — Encounter (HOSPITAL_BASED_OUTPATIENT_CLINIC_OR_DEPARTMENT_OTHER): Payer: Self-pay

## 2022-12-26 DIAGNOSIS — R69 Illness, unspecified: Secondary | ICD-10-CM | POA: Diagnosis not present

## 2023-04-16 ENCOUNTER — Ambulatory Visit: Payer: Medicare HMO | Admitting: Family

## 2023-04-18 ENCOUNTER — Ambulatory Visit: Payer: Medicare HMO | Admitting: Family

## 2023-04-21 ENCOUNTER — Encounter: Payer: Self-pay | Admitting: Family

## 2023-06-10 DIAGNOSIS — M2061 Acquired deformities of toe(s), unspecified, right foot: Secondary | ICD-10-CM | POA: Diagnosis not present

## 2023-07-24 DIAGNOSIS — M2061 Acquired deformities of toe(s), unspecified, right foot: Secondary | ICD-10-CM | POA: Diagnosis not present

## 2023-08-08 DIAGNOSIS — Z79899 Other long term (current) drug therapy: Secondary | ICD-10-CM | POA: Diagnosis not present

## 2023-08-08 DIAGNOSIS — L988 Other specified disorders of the skin and subcutaneous tissue: Secondary | ICD-10-CM | POA: Diagnosis not present

## 2023-08-08 DIAGNOSIS — M2061 Acquired deformities of toe(s), unspecified, right foot: Secondary | ICD-10-CM | POA: Diagnosis not present

## 2023-08-08 DIAGNOSIS — L905 Scar conditions and fibrosis of skin: Secondary | ICD-10-CM | POA: Diagnosis not present

## 2023-08-08 DIAGNOSIS — Z7901 Long term (current) use of anticoagulants: Secondary | ICD-10-CM | POA: Diagnosis not present

## 2023-08-08 DIAGNOSIS — F32A Depression, unspecified: Secondary | ICD-10-CM | POA: Diagnosis not present

## 2023-08-08 DIAGNOSIS — Z9981 Dependence on supplemental oxygen: Secondary | ICD-10-CM | POA: Diagnosis not present

## 2023-08-08 DIAGNOSIS — I10 Essential (primary) hypertension: Secondary | ICD-10-CM | POA: Diagnosis not present

## 2023-08-08 DIAGNOSIS — K219 Gastro-esophageal reflux disease without esophagitis: Secondary | ICD-10-CM | POA: Diagnosis not present

## 2023-08-08 DIAGNOSIS — L859 Epidermal thickening, unspecified: Secondary | ICD-10-CM | POA: Diagnosis not present

## 2023-08-08 DIAGNOSIS — G309 Alzheimer's disease, unspecified: Secondary | ICD-10-CM | POA: Diagnosis not present

## 2023-08-12 DIAGNOSIS — Z89421 Acquired absence of other right toe(s): Secondary | ICD-10-CM | POA: Diagnosis not present

## 2023-08-17 DIAGNOSIS — I4891 Unspecified atrial fibrillation: Secondary | ICD-10-CM | POA: Diagnosis not present

## 2023-08-21 DIAGNOSIS — Z89421 Acquired absence of other right toe(s): Secondary | ICD-10-CM | POA: Diagnosis not present

## 2023-08-28 DIAGNOSIS — Z89421 Acquired absence of other right toe(s): Secondary | ICD-10-CM | POA: Diagnosis not present

## 2023-09-04 ENCOUNTER — Encounter: Payer: Self-pay | Admitting: Family

## 2023-09-04 DIAGNOSIS — I10 Essential (primary) hypertension: Secondary | ICD-10-CM

## 2023-09-04 DIAGNOSIS — I482 Chronic atrial fibrillation, unspecified: Secondary | ICD-10-CM

## 2023-09-04 DIAGNOSIS — I509 Heart failure, unspecified: Secondary | ICD-10-CM

## 2023-09-04 MED ORDER — DILTIAZEM HCL ER COATED BEADS 180 MG PO CP24: 180.0000 mg | ORAL_CAPSULE | Freq: Every day | ORAL | 0 refills | Status: DC

## 2023-09-04 MED ORDER — XARELTO 20 MG PO TABS: 20.0000 mg | ORAL_TABLET | Freq: Every day | ORAL | 0 refills | Status: DC

## 2023-09-04 MED ORDER — CARVEDILOL 6.25 MG PO TABS: 6.2500 mg | ORAL_TABLET | Freq: Two times a day (BID) | ORAL | 0 refills | Status: DC

## 2023-09-18 ENCOUNTER — Encounter: Payer: Self-pay | Admitting: Family

## 2023-09-18 ENCOUNTER — Ambulatory Visit (INDEPENDENT_AMBULATORY_CARE_PROVIDER_SITE_OTHER): Admitting: Family

## 2023-09-18 ENCOUNTER — Ambulatory Visit

## 2023-09-18 ENCOUNTER — Ambulatory Visit: Payer: Self-pay | Admitting: Family

## 2023-09-18 VITALS — BP 138/86 | HR 83 | Temp 98.2°F | Ht 60.0 in | Wt 168.2 lb

## 2023-09-18 DIAGNOSIS — R5383 Other fatigue: Secondary | ICD-10-CM

## 2023-09-18 DIAGNOSIS — R600 Localized edema: Secondary | ICD-10-CM | POA: Insufficient documentation

## 2023-09-18 DIAGNOSIS — G309 Alzheimer's disease, unspecified: Secondary | ICD-10-CM

## 2023-09-18 DIAGNOSIS — I10 Essential (primary) hypertension: Secondary | ICD-10-CM

## 2023-09-18 DIAGNOSIS — I4891 Unspecified atrial fibrillation: Secondary | ICD-10-CM

## 2023-09-18 DIAGNOSIS — R0609 Other forms of dyspnea: Secondary | ICD-10-CM | POA: Insufficient documentation

## 2023-09-18 DIAGNOSIS — R3915 Urgency of urination: Secondary | ICD-10-CM

## 2023-09-18 DIAGNOSIS — K529 Noninfective gastroenteritis and colitis, unspecified: Secondary | ICD-10-CM

## 2023-09-18 DIAGNOSIS — F419 Anxiety disorder, unspecified: Secondary | ICD-10-CM

## 2023-09-18 DIAGNOSIS — I482 Chronic atrial fibrillation, unspecified: Secondary | ICD-10-CM | POA: Diagnosis not present

## 2023-09-18 DIAGNOSIS — E782 Mixed hyperlipidemia: Secondary | ICD-10-CM | POA: Diagnosis not present

## 2023-09-18 DIAGNOSIS — K644 Residual hemorrhoidal skin tags: Secondary | ICD-10-CM

## 2023-09-18 DIAGNOSIS — Z8619 Personal history of other infectious and parasitic diseases: Secondary | ICD-10-CM | POA: Insufficient documentation

## 2023-09-18 DIAGNOSIS — F32A Depression, unspecified: Secondary | ICD-10-CM

## 2023-09-18 DIAGNOSIS — R350 Frequency of micturition: Secondary | ICD-10-CM | POA: Diagnosis not present

## 2023-09-18 DIAGNOSIS — I509 Heart failure, unspecified: Secondary | ICD-10-CM | POA: Diagnosis not present

## 2023-09-18 DIAGNOSIS — F0280 Dementia in other diseases classified elsewhere without behavioral disturbance: Secondary | ICD-10-CM

## 2023-09-18 LAB — COMPREHENSIVE METABOLIC PANEL WITH GFR
ALT: 12 U/L (ref 0–35)
AST: 18 U/L (ref 0–37)
Albumin: 4.4 g/dL (ref 3.5–5.2)
Alkaline Phosphatase: 85 U/L (ref 39–117)
BUN: 8 mg/dL (ref 6–23)
CO2: 28 meq/L (ref 19–32)
Calcium: 9.7 mg/dL (ref 8.4–10.5)
Chloride: 103 meq/L (ref 96–112)
Creatinine, Ser: 0.65 mg/dL (ref 0.40–1.20)
GFR: 83.98 mL/min (ref >60.00)
Glucose, Bld: 94 mg/dL (ref 70–99)
Potassium: 3.7 meq/L (ref 3.5–5.1)
Sodium: 141 meq/L (ref 135–145)
Total Bilirubin: 0.9 mg/dL (ref 0.2–1.2)
Total Protein: 6.7 g/dL (ref 6.0–8.3)

## 2023-09-18 LAB — CBC WITH DIFFERENTIAL/PLATELET
Basophils Absolute: 0 10*3/uL (ref 0.0–0.1)
Basophils Relative: 0.6 % (ref 0.0–3.0)
Eosinophils Absolute: 0.2 10*3/uL (ref 0.0–0.7)
Eosinophils Relative: 3.4 % (ref 0.0–5.0)
HCT: 41.9 % (ref 36.0–46.0)
Hemoglobin: 13.9 g/dL (ref 12.0–15.0)
Lymphocytes Relative: 32.3 % (ref 12.0–46.0)
Lymphs Abs: 2.2 10*3/uL (ref 0.7–4.0)
MCHC: 33.1 g/dL (ref 30.0–36.0)
MCV: 94.3 fl (ref 78.0–100.0)
Monocytes Absolute: 0.7 10*3/uL (ref 0.1–1.0)
Monocytes Relative: 9.9 % (ref 3.0–12.0)
Neutro Abs: 3.6 10*3/uL (ref 1.4–7.7)
Neutrophils Relative %: 53.8 % (ref 43.0–77.0)
Platelets: 275 10*3/uL (ref 150.0–400.0)
RBC: 4.44 Mil/uL (ref 3.87–5.11)
RDW: 14.1 % (ref 11.5–15.5)
WBC: 6.8 10*3/uL (ref 4.0–10.5)

## 2023-09-18 LAB — TSH: TSH: 1.41 u[IU]/mL (ref 0.35–5.50)

## 2023-09-18 LAB — T4, FREE: Free T4: 1.05 ng/dL (ref 0.60–1.60)

## 2023-09-18 LAB — LIPID PANEL
Cholesterol: 197 mg/dL (ref 0–200)
HDL: 89.3 mg/dL (ref >39.00)
LDL Cholesterol: 84 mg/dL (ref 0–99)
NonHDL: 107.65
Total CHOL/HDL Ratio: 2
Triglycerides: 119 mg/dL (ref 0.0–149.0)
VLDL: 23.8 mg/dL (ref 0.0–40.0)

## 2023-09-18 LAB — BRAIN NATRIURETIC PEPTIDE: Pro B Natriuretic peptide (BNP): 193 pg/mL — ABNORMAL HIGH (ref 0.0–100.0)

## 2023-09-18 LAB — VITAMIN B12: Vitamin B-12: 428 pg/mL (ref 211–911)

## 2023-09-18 MED ORDER — PRAVASTATIN SODIUM 40 MG PO TABS: 40.0000 mg | ORAL_TABLET | Freq: Every day | ORAL | 2 refills | Status: DC

## 2023-09-18 MED ORDER — XARELTO 20 MG PO TABS: 20.0000 mg | ORAL_TABLET | Freq: Every day | ORAL | Status: DC

## 2023-09-18 MED ORDER — VENLAFAXINE HCL ER 37.5 MG PO CP24: 37.5000 mg | ORAL_CAPSULE | Freq: Every day | ORAL | 0 refills | Status: DC

## 2023-09-18 MED ORDER — HYDROCORTISONE ACETATE 25 MG RE SUPP: 25.0000 mg | Freq: Two times a day (BID) | RECTAL | 0 refills | Status: DC

## 2023-09-18 NOTE — Assessment & Plan Note (Signed)
 Active.  Continue carvedilol  and xarelto  Overdue for f/u with cardiology advised to call and schedule f/u

## 2023-09-18 NOTE — Assessment & Plan Note (Signed)
 Rx effexor  restart at 37.5 mg once daily F/u one month

## 2023-09-18 NOTE — Assessment & Plan Note (Signed)
 Stool cultures today  Fod map diet discussed

## 2023-09-18 NOTE — Assessment & Plan Note (Signed)
 Echocardiogram ordered  Pt to also f/u with cardiology

## 2023-09-18 NOTE — Assessment & Plan Note (Signed)
 Pt advised of the following:  Continue medication as prescribed. Monitor blood pressure periodically and/or when you feel symptomatic. Goal is <130/90 on average. Ensure that you have rested for 30 minutes prior to checking your blood pressure. Record your readings and bring them to your next visit if necessary.work on a low sodium diet.

## 2023-09-18 NOTE — Patient Instructions (Addendum)
  Call to schedule follow up for cardiology.   Merilee Hoy Hummer, NP  [address]  [address]  [place], [place] [address]  Phone: tel:[phone]  fax:[phone]    ------------------------------------ Restart venlafaxine  ER 37.5 mg    ------------------------------------  Ordered an echocardiogram, ultrasound of the heart.  Please let us  know if you have not heard back within 2 weeks about the referral.  ------------------------------------  Take furosemide  as needed for lower leg swelling.

## 2023-09-18 NOTE — Assessment & Plan Note (Signed)
 Will assess next visit  Multiple concerns today to evaluate limiting ability to evaluate this thoroughly

## 2023-09-18 NOTE — Progress Notes (Signed)
 Established Patient Office Visit  Subjective:      CC:  Chief Complaint  Patient presents with   Leg Swelling    C/o B leg swelling and B shoulder pain and wants to discuss medications.     HPI: Christie Sanders is a 78 y.o. female presenting on 09/18/2023 for Leg Swelling (C/o B leg swelling and B shoulder pain and wants to discuss medications. )  Pedal edema:  She does have DOE and gets out of breath more than her usual. She does have increased fatigue often throughout the day. The swelling has been more noticeable over the last few months. She used to take furosemide  20 mg every other day she is still taking potassium   HTN and also afib: followed by cardiology however last seen in may 2024 and was due for repeat echocardiogram of which she did not get. She is on metoprolol   HLD: was taking pravastatin  40 mg nightly.   Anxiety: was on venlafaxine , also along with depression.   New complaints: Diarrhea always after eating, sometimes explosive. Has been chronic.      Social history:  Relevant past medical, surgical, family and social history reviewed and updated as indicated. Interim medical history since our last visit reviewed.  Allergies and medications reviewed and updated.  DATA REVIEWED: CHART IN EPIC     ROS: Negative unless specifically indicated above in HPI.    Current Outpatient Medications:    acetaminophen  (TYLENOL ) 325 MG tablet, Take 2 tablets (650 mg total) by mouth every 6 (six) hours as needed for headache., Disp: , Rfl:    carvedilol  (COREG ) 6.25 MG tablet, Take 1 tablet (6.25 mg total) by mouth 2 (two) times daily., Disp: 180 tablet, Rfl: 0   Cholecalciferol (VITAMIN D-3 PO), Take by mouth., Disp: , Rfl:    diltiazem  (CARDIZEM  CD) 180 MG 24 hr capsule, Take 1 capsule (180 mg total) by mouth daily., Disp: 90 capsule, Rfl: 0   hydrocortisone  (ANUSOL -HC) 25 MG suppository, Place 1 suppository (25 mg total) rectally 2 (two) times daily., Disp: 12  suppository, Rfl: 0   potassium chloride  (KLOR-CON ) 10 MEQ tablet, Take by mouth., Disp: , Rfl:    venlafaxine  XR (EFFEXOR  XR) 37.5 MG 24 hr capsule, Take 1 capsule (37.5 mg total) by mouth daily with breakfast., Disp: 90 capsule, Rfl: 0   furosemide  (LASIX ) 20 MG tablet, Take 1 tablet (20 mg total) by mouth every other day. (Patient not taking: Reported on 09/18/2023), Disp: 30 tablet, Rfl:    pravastatin  (PRAVACHOL ) 40 MG tablet, Take 1 tablet (40 mg total) by mouth at bedtime., Disp: 90 tablet, Rfl: 2   XARELTO  20 MG TABS tablet, Take 1 tablet (20 mg total) by mouth daily., Disp: , Rfl:       Objective:    BP 138/86   Pulse 83   Temp 98.2 F (36.8 C) (Oral)   Ht 5' (1.524 m)   Wt 168 lb 4 oz (76.3 kg)   SpO2 97%   BMI 32.86 kg/m   Wt Readings from Last 3 Encounters:  09/18/23 168 lb 4 oz (76.3 kg)  10/22/22 156 lb (70.8 kg)  08/08/22 156 lb 12.8 oz (71.1 kg)    Physical Exam Constitutional:      General: She is not in acute distress.    Appearance: Normal appearance. She is normal weight. She is not ill-appearing, toxic-appearing or diaphoretic.  HENT:     Head: Normocephalic.   Cardiovascular:  Rate and Rhythm: Normal rate. Rhythm irregularly irregular.     Heart sounds: Murmur heard.  Pulmonary:     Effort: Pulmonary effort is normal.     Breath sounds: Normal breath sounds.   Musculoskeletal:        General: Normal range of motion.     Right lower leg: 2+ Edema present.     Left lower leg: 2+ Edema present.   Neurological:     General: No focal deficit present.     Mental Status: She is alert and oriented to person, place, and time. Mental status is at baseline.   Psychiatric:        Mood and Affect: Mood normal.        Behavior: Behavior normal.        Thought Content: Thought content normal.        Judgment: Judgment normal.           Assessment & Plan:  Pedal edema Assessment & Plan: Non pitting Likely more in relation to stagnancy   Recommendation for compression stockings, watch salt intake, elevate when sitting.  Cmp today  Bnp as well to r/o CHF flare as also with DOE Furosemide  prn   Orders: -     Brain natriuretic peptide  Primary hypertension Assessment & Plan: Pt advised of the following:  Continue medication as prescribed. Monitor blood pressure periodically and/or when you feel symptomatic. Goal is <130/90 on average. Ensure that you have rested for 30 minutes prior to checking your blood pressure. Record your readings and bring them to your next visit if necessary.work on a low sodium diet.   Orders: -     Comprehensive metabolic panel with GFR; Future -     Xarelto ; Take 1 tablet (20 mg total) by mouth daily.  Chronic atrial fibrillation [facility]) Assessment & Plan: Active.  Continue carvedilol  and xarelto  Overdue for f/u with cardiology advised to call and schedule f/u  Orders: -     Xarelto ; Take 1 tablet (20 mg total) by mouth daily.  Chronic congestive heart failure, unspecified heart failure type [facility]) Assessment & Plan: Echocardiogram ordered  Pt to also f/u with cardiology   Orders: -     ECHOCARDIOGRAM COMPLETE; Future -     Brain natriuretic peptide -     Xarelto ; Take 1 tablet (20 mg total) by mouth daily.  Urinary frequency -     Urinalysis w microscopic + reflex cultur  Other fatigue -     Vitamin B12 -     TSH -     T4, free -     CBC with Differential/Platelet  Essential hypertension  Mixed hyperlipidemia -     Lipid panel -     Pravastatin  Sodium; Take 1 tablet (40 mg total) by mouth at bedtime.  Dispense: 90 tablet; Refill: 2  Anxiety and depression Assessment & Plan: Rx effexor  restart at 37.5 mg once daily F/u one month   Orders: -     Venlafaxine  HCl ER; Take 1 capsule (37.5 mg total) by mouth daily with breakfast.  Dispense: 90 capsule; Refill: 0  External hemorrhoids -     Hydrocortisone  Acetate; Place 1 suppository (25 mg total) rectally 2 (two) times  daily.  Dispense: 12 suppository; Refill: 0  Chronic diarrhea Assessment & Plan: Stool cultures today  Fod map diet discussed  Orders: -     C. difficile GDH and Toxin A/B; Future -     Gastrointestinal Pathogen Pnl RT, PCR; Future -  Giardia antigen; Future  Urinary urgency Assessment & Plan: Rule out UTI  U/a culture today pending results.    History of sepsis  Alzheimer disease [facility]) Assessment & Plan: Will assess next visit  Multiple concerns today to evaluate limiting ability to evaluate this thoroughly   Atrial fibrillation with rapid ventricular response [facility]) Assessment & Plan: Active.  Continue carvedilol  and xarelto  Overdue for f/u with cardiology advised to call and schedule f/u   DOE (dyspnea on exertion)     Return in about 2 weeks (around 10/02/2023) for f/u anxiety and pedal edema.  Ginger Patrick, MSN, APRN, FNP-C [facility]

## 2023-09-18 NOTE — Assessment & Plan Note (Signed)
 Rule out UTI  U/a culture today pending results.

## 2023-09-18 NOTE — Assessment & Plan Note (Signed)
 Non pitting Likely more in relation to stagnancy  Recommendation for compression stockings, watch salt intake, elevate when sitting.  Cmp today  Bnp as well to r/o CHF flare as also with DOE Furosemide  prn

## 2023-09-20 LAB — URINALYSIS W MICROSCOPIC + REFLEX CULTURE
Bacteria, UA: NONE SEEN /HPF
Bilirubin Urine: NEGATIVE
Glucose, UA: NEGATIVE
Hgb urine dipstick: NEGATIVE
Hyaline Cast: NONE SEEN /LPF
Ketones, ur: NEGATIVE
Nitrites, Initial: NEGATIVE
Protein, ur: NEGATIVE
RBC / HPF: NONE SEEN /HPF (ref 0–2)
Specific Gravity, Urine: 1.012 (ref 1.001–1.035)
Squamous Epithelial / HPF: NONE SEEN /HPF (ref <=5)
WBC, UA: NONE SEEN /HPF (ref 0–5)
pH: 7 (ref 5.0–8.0)

## 2023-09-20 LAB — URINE CULTURE
MICRO NUMBER:: 16632523
Result:: NO GROWTH
SPECIMEN QUALITY:: ADEQUATE

## 2023-09-20 LAB — CULTURE INDICATED

## 2023-09-24 ENCOUNTER — Telehealth: Payer: Self-pay

## 2023-09-24 NOTE — Telephone Encounter (Signed)
 Copied from CRM [id number]. Topic: General - Other >> Sep 24, 2023  9:17 AM Rosina BIRCH wrote: Reason for CRM: patient daughter n law called stating she bought the patient in last week to get a stool kit. The patient daughter n law stated the patient did not do it right so they need another one. Patient daughter n law stated her daughter  ([place] side) is coming today for an appointment to see MD Tower and want to see if she can pick it up for the patient. The patient need one vial and a collection hat CB [phone]

## 2023-10-01 ENCOUNTER — Ambulatory Visit: Admitting: Family

## 2023-10-03 ENCOUNTER — Encounter: Payer: Self-pay | Admitting: *Deleted

## 2023-10-03 ENCOUNTER — Ambulatory Visit (INDEPENDENT_AMBULATORY_CARE_PROVIDER_SITE_OTHER)
Admission: RE | Admit: 2023-10-03 | Discharge: 2023-10-03 | Disposition: A | Discharge status: Home / Self Care | Source: Ambulatory Visit | Attending: Family

## 2023-10-03 ENCOUNTER — Other Ambulatory Visit: Payer: Self-pay

## 2023-10-03 ENCOUNTER — Telehealth: Payer: Self-pay | Admitting: Family

## 2023-10-03 ENCOUNTER — Ambulatory Visit: Payer: Self-pay | Admitting: Family

## 2023-10-03 ENCOUNTER — Ambulatory Visit (INDEPENDENT_AMBULATORY_CARE_PROVIDER_SITE_OTHER): Admitting: Family

## 2023-10-03 VITALS — BP 134/68 | HR 78 | Temp 98.6°F | Ht 60.0 in | Wt 167.0 lb

## 2023-10-03 DIAGNOSIS — I50813 Acute on chronic right heart failure: Secondary | ICD-10-CM | POA: Insufficient documentation

## 2023-10-03 DIAGNOSIS — R0602 Shortness of breath: Secondary | ICD-10-CM | POA: Diagnosis not present

## 2023-10-03 DIAGNOSIS — F419 Anxiety disorder, unspecified: Secondary | ICD-10-CM

## 2023-10-03 DIAGNOSIS — R0609 Other forms of dyspnea: Secondary | ICD-10-CM

## 2023-10-03 DIAGNOSIS — H00014 Hordeolum externum left upper eyelid: Secondary | ICD-10-CM | POA: Diagnosis not present

## 2023-10-03 DIAGNOSIS — K644 Residual hemorrhoidal skin tags: Secondary | ICD-10-CM | POA: Diagnosis not present

## 2023-10-03 DIAGNOSIS — F32A Depression, unspecified: Secondary | ICD-10-CM

## 2023-10-03 DIAGNOSIS — E876 Hypokalemia: Secondary | ICD-10-CM

## 2023-10-03 DIAGNOSIS — R600 Localized edema: Secondary | ICD-10-CM

## 2023-10-03 DIAGNOSIS — R7989 Other specified abnormal findings of blood chemistry: Secondary | ICD-10-CM

## 2023-10-03 DIAGNOSIS — I482 Chronic atrial fibrillation, unspecified: Secondary | ICD-10-CM | POA: Diagnosis not present

## 2023-10-03 DIAGNOSIS — K529 Noninfective gastroenteritis and colitis, unspecified: Secondary | ICD-10-CM

## 2023-10-03 DIAGNOSIS — R197 Diarrhea, unspecified: Secondary | ICD-10-CM | POA: Diagnosis not present

## 2023-10-03 MED ORDER — HYDROCORTISONE ACETATE 25 MG RE SUPP: 25.0000 mg | Freq: Two times a day (BID) | RECTAL | 0 refills | Status: DC

## 2023-10-03 MED ORDER — POTASSIUM CHLORIDE ER 10 MEQ PO TBCR: 10.0000 meq | EXTENDED_RELEASE_TABLET | Freq: Two times a day (BID) | ORAL | Status: AC

## 2023-10-03 NOTE — Patient Instructions (Signed)
  Call to schedule follow up for cardiology.   Merilee Hoy Hummer, NP  [address]  [address]  [place], [place] [address]  Phone: tel:[phone]  fax:[phone]    ------------------------------------ Restart venlafaxine  ER 37.5 mg    ------------------------------------  Ordered an echocardiogram, ultrasound of the heart.  Please let us  know if you have not heard back within 2 weeks about the referral.  ------------------------------------  Take furosemide  as needed for lower leg swelling.

## 2023-10-03 NOTE — Assessment & Plan Note (Addendum)
 Cxr ordered today  BNP elevated last visit Continue furosemide  prn, for now will keep once daily  Elevate legs as able Red flag precautions advised

## 2023-10-03 NOTE — Assessment & Plan Note (Signed)
 Continue furosemide   Elevate legs Wear compression stockings improving

## 2023-10-03 NOTE — Assessment & Plan Note (Signed)
 Start effexor  37.5 mg once daily

## 2023-10-03 NOTE — Assessment & Plan Note (Signed)
 Echocardiogram was ordered not yet scheduled.  Advised pt to call to get in with cardiologist  Red flag precautions given to pt  Continue furosemide   Monitor > 3 lb weight gain in one day

## 2023-10-03 NOTE — Assessment & Plan Note (Signed)
 Active. Continue carvedilol  6.25 mg twice daily and xarelto  20 mg once daily

## 2023-10-03 NOTE — Progress Notes (Signed)
 Established Patient Office Visit  Subjective:   Patient ID: Christie Sanders, female    DOB: [birth year]  Age: 78 y.o. MRN: [id number]  CC:  Chief Complaint  Patient presents with   Medical Management of Chronic Issues    HPI: Christie Sanders is a 78 y.o. female presenting on 10/03/2023 for Medical Management of Chronic Issues  Left upper eyelid with some swelling and tenderness with some redness. No discharge from the eye slightly blurry. She is overdue for glasses, states her prescription is off a little bit normally.   Pedal edema: has been using furosemide  daily as this is really helping her pedal edema. She is elevating her legs when she is sitting down. She does still report increased DOE in the last few weeks, not improving much. She does have chest tightness that comes and goes, not sharp and stabbing. Sometimes hard to get a deep breath. Did have elevated BNP 6/26 last visit.   Diarrhea, brought in stool cultures today. Will pend results.   Anxiety, depression: was advised last visit to restart effexor  37.5 mg once daily. She didn't start this yet but plans to.   Wt Readings from Last 3 Encounters:  10/03/23 167 lb (75.8 kg)  09/18/23 168 lb 4 oz (76.3 kg)  10/22/22 156 lb (70.8 kg)         ROS: Negative unless specifically indicated above in HPI.   Relevant past medical history reviewed and updated as indicated.   Allergies and medications reviewed and updated.   Current Outpatient Medications:    acetaminophen  (TYLENOL ) 325 MG tablet, Take 2 tablets (650 mg total) by mouth every 6 (six) hours as needed for headache., Disp: , Rfl:    carvedilol  (COREG ) 6.25 MG tablet, Take 1 tablet (6.25 mg total) by mouth 2 (two) times daily., Disp: 180 tablet, Rfl: 0   Cholecalciferol (VITAMIN D-3 PO), Take by mouth., Disp: , Rfl:    diltiazem  (CARDIZEM  CD) 180 MG 24 hr capsule, Take 1 capsule (180 mg total) by mouth daily., Disp: 90 capsule, Rfl: 0   furosemide  (LASIX ) 20 MG  tablet, Take 1 tablet (20 mg total) by mouth every other day., Disp: 30 tablet, Rfl:    pravastatin  (PRAVACHOL ) 40 MG tablet, Take 1 tablet (40 mg total) by mouth at bedtime., Disp: 90 tablet, Rfl: 2   venlafaxine  XR (EFFEXOR  XR) 37.5 MG 24 hr capsule, Take 1 capsule (37.5 mg total) by mouth daily with breakfast., Disp: 90 capsule, Rfl: 0   XARELTO  20 MG TABS tablet, Take 1 tablet (20 mg total) by mouth daily., Disp: , Rfl:    hydrocortisone  (ANUSOL -HC) 25 MG suppository, Place 1 suppository (25 mg total) rectally 2 (two) times daily., Disp: 12 suppository, Rfl: 0   potassium chloride  (KLOR-CON ) 10 MEQ tablet, Take 1 tablet (10 mEq total) by mouth 2 (two) times daily., Disp: , Rfl:   Allergies  Allergen Reactions   Risperidone Anaphylaxis   Codeine Swelling   Hydrocodone-Acetaminophen  Other (See Comments)    Reaction: Sweating (intolerance)    Objective:   BP 134/68   Pulse 78   Temp 98.6 F (37 C) (Oral)   Ht 5' (1.524 m)   Wt 167 lb (75.8 kg)   SpO2 98%   BMI 32.61 kg/m    Physical Exam Vitals reviewed.  Constitutional:      General: She is not in acute distress.    Appearance: Normal appearance. She is normal weight. She is not ill-appearing, toxic-appearing or diaphoretic.  HENT:     Head: Normocephalic.  Eyes:     Comments: Swelling erythema and mild tenderness to left upper eyelid No visual discharge   Cardiovascular:     Rate and Rhythm: Normal rate.  Pulmonary:     Effort: Pulmonary effort is normal.  Musculoskeletal:        General: Normal range of motion.  Neurological:     General: No focal deficit present.     Mental Status: She is alert and oriented to person, place, and time. Mental status is at baseline.  Psychiatric:        Mood and Affect: Mood normal.        Behavior: Behavior normal.        Thought Content: Thought content normal.        Judgment: Judgment normal.     Assessment & Plan:  Elevated brain natriuretic peptide (BNP) level -     DG  Chest 2 View; Future  DOE (dyspnea on exertion) Assessment & Plan: Cxr ordered today  BNP elevated last visit Continue furosemide  prn, for now will keep once daily  Elevate legs as able Red flag precautions advised    Orders: -     DG Chest 2 View; Future  External hemorrhoids -     Hydrocortisone  Acetate; Place 1 suppository (25 mg total) rectally 2 (two) times daily.  Dispense: 12 suppository; Refill: 0  Hypokalemia -     Potassium Chloride  ER; Take 1 tablet (10 mEq total) by mouth 2 (two) times daily.  Anxiety and depression Assessment & Plan: Start effexor  37.5 mg once daily    Hordeolum externum of left upper eyelid Assessment & Plan: Warm compresses throughout the day tylenol  prn     Pedal edema Assessment & Plan: Continue furosemide   Elevate legs Wear compression stockings improving   Chronic atrial fibrillation (HCC) Assessment & Plan: Active. 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  Advised pt to call to get in with cardiologist  Red flag precautions given to pt  Continue furosemide   Monitor > 3 lb weight gain in one day       Follow up plan: No follow-ups on file.  Ginger Patrick, FNP

## 2023-10-03 NOTE — Telephone Encounter (Signed)
 Patient has been sent contact information for Echocardiogram via MyChart Message  Pt can call and schedule with [facility] - No Auth Required.

## 2023-10-03 NOTE — Assessment & Plan Note (Signed)
 Warm compresses throughout the day tylenol  prn

## 2023-10-03 NOTE — Telephone Encounter (Signed)
 Can we look into the echocardiogram? Pt has not heard anything back to have this scheduled.

## 2023-10-06 LAB — C. DIFFICILE GDH AND TOXIN A/B
GDH ANTIGEN: NOT DETECTED
MICRO NUMBER:: 16688114
SPECIMEN QUALITY:: ADEQUATE
TOXIN A AND B: NOT DETECTED

## 2023-10-06 NOTE — Telephone Encounter (Signed)
 Unable to reach patient, advise patients son to have patient call us  back when she is available.

## 2023-10-06 NOTE — Telephone Encounter (Signed)
 Can we please call pt with following info, not sure how well she uses mychart.   Good afternoon,     Ginger Patrick, NP placed an order for an Echocardiogram    I have sent your order to [facility] department.    You may reach out to them to schedule your appointment at your convenience.      They can be reached at [phone]     Please let me know if you have issues getting scheduled.    Cynthea Seip, CMA Referral Coordinato

## 2023-10-07 LAB — GIARDIA ANTIGEN
MICRO NUMBER:: 16688090
RESULT:: NOT DETECTED
SPECIMEN QUALITY:: ADEQUATE

## 2023-10-07 LAB — GASTROINTESTINAL PATHOGEN PNL
CampyloBacter Group: NOT DETECTED
Norovirus GI/GII: NOT DETECTED
Rotavirus A: NOT DETECTED
Salmonella species: NOT DETECTED
Shiga Toxin 1: NOT DETECTED
Shiga Toxin 2: NOT DETECTED
Shigella Species: NOT DETECTED
Vibrio Group: NOT DETECTED
Yersinia enterocolitica: NOT DETECTED

## 2023-10-07 NOTE — Telephone Encounter (Signed)
 Could we call this office below (I put in the contact information) and inquire why pt needs a referral? Is she not established there?

## 2023-10-07 NOTE — Telephone Encounter (Signed)
 Called and spoke to pt's son per DPR. He will call to schedule the ECHO.

## 2023-10-07 NOTE — Telephone Encounter (Signed)
 No I had advised her last visit to call her cardiologist of whom she saw in 2024 for follow up so I believe this is what she is talking about, getting an appt with cardiology.

## 2023-10-08 ENCOUNTER — Encounter: Payer: Self-pay | Admitting: Family

## 2023-10-08 DIAGNOSIS — I480 Paroxysmal atrial fibrillation: Secondary | ICD-10-CM

## 2023-10-08 DIAGNOSIS — E782 Mixed hyperlipidemia: Secondary | ICD-10-CM

## 2023-10-08 DIAGNOSIS — I10 Essential (primary) hypertension: Secondary | ICD-10-CM

## 2023-10-08 NOTE — Telephone Encounter (Signed)
 See new message from family about the ECHO being out to September.

## 2023-10-27 ENCOUNTER — Ambulatory Visit (INDEPENDENT_AMBULATORY_CARE_PROVIDER_SITE_OTHER): Payer: Medicare HMO

## 2023-10-27 VITALS — BP 134/68 | Ht 60.0 in | Wt 167.0 lb

## 2023-10-27 DIAGNOSIS — Z0000 Encounter for general adult medical examination without abnormal findings: Secondary | ICD-10-CM

## 2023-10-27 NOTE — Progress Notes (Signed)
 Because this visit was a virtual/telehealth visit,  certain criteria was not obtained, such a blood pressure, CBG if applicable, and timed get up and go. Any medications not marked as taking were not mentioned during the medication reconciliation part of the visit. Any vitals not documented were not able to be obtained due to this being a telehealth visit or patient was unable to self-report a recent blood pressure reading due to a lack of equipment at home via telehealth. Vitals that have been documented are verbally provided by the patient.   This visit was performed by a medical professional under my direct supervision. I was immediately available for consultation/collaboration. I have reviewed and agree with the Annual Wellness Visit documentation.  Subjective:   Christie Sanders is a 78 y.o. who presents for a Medicare Wellness preventive visit.  As a reminder, Annual Wellness Visits don't include a physical exam, and some assessments may be limited, especially if this visit is performed virtually. We may recommend an in-person follow-up visit with your provider if needed.  Visit Complete: Virtual I connected with  Christie Sanders on 10/27/23 by a audio enabled telemedicine application and verified that I am speaking with the correct person using two identifiers.  Patient Location: Home  Provider Location: Home Office  I discussed the limitations of evaluation and management by telemedicine. The patient expressed understanding and agreed to proceed.  Vital Signs: Because this visit was a virtual/telehealth visit, some criteria may be missing or patient reported. Any vitals not documented were not able to be obtained and vitals that have been documented are patient reported.  VideoDeclined- This patient declined Librarian, academic. Therefore the visit was completed with audio only.  Persons Participating in Visit: Patient.  AWV Sanders: No: Patient  Medicare AWV Sanders was not completed prior to this visit.  Cardiac Risk Factors include: advanced age (>14men, >39 women);obesity (BMI >30kg/m2);hypertension;Other (see comment), Risk factor comments: afib     Objective:    Today's Vitals   10/27/23 1616  BP: 134/68  Weight: 167 lb (75.8 kg)  Height: 5' (1.524 m)   Body mass index is 32.61 kg/m.     10/27/2023    4:16 PM 10/22/2022    2:50 PM 04/26/2021   11:45 AM 09/07/2013   10:30 AM  Advanced Directives  Does Patient Have a Medical Advance Directive? Yes No No Patient has advance directive, copy not in chart   Type of Advance Directive Healthcare Power of [place];Living will   Living will   Does patient want to make changes to medical advance directive? No - Patient declined     Copy of Healthcare Power of Attorney in Chart? No - copy requested     Would patient like information on creating a medical advance directive?  No - Patient declined No - Patient declined      Data saved with a previous flowsheet row definition    Current Medications (verified) Outpatient Encounter Medications as of 10/27/2023  Medication Sig   acetaminophen  (TYLENOL ) 325 MG tablet Take 2 tablets (650 mg total) by mouth every 6 (six) hours as needed for headache.   carvedilol  (COREG ) 6.25 MG tablet Take 1 tablet (6.25 mg total) by mouth 2 (two) times daily.   Cholecalciferol (VITAMIN D-3 PO) Take by mouth.   diltiazem  (CARDIZEM  CD) 180 MG 24 hr capsule Take 1 capsule (180 mg total) by mouth daily.   furosemide  (LASIX ) 20 MG tablet Take 1 tablet (20 mg total) by  mouth every other day.   hydrocortisone  (ANUSOL -HC) 25 MG suppository Place 1 suppository (25 mg total) rectally 2 (two) times daily.   potassium chloride  (KLOR-CON ) 10 MEQ tablet Take 1 tablet (10 mEq total) by mouth 2 (two) times daily.   pravastatin  (PRAVACHOL ) 40 MG tablet Take 1 tablet (40 mg total) by mouth at bedtime.   venlafaxine  XR (EFFEXOR  XR) 37.5 MG 24 hr capsule Take 1  capsule (37.5 mg total) by mouth daily with breakfast.   XARELTO  20 MG TABS tablet Take 1 tablet (20 mg total) by mouth daily.   No facility-administered encounter medications on file as of 10/27/2023.    Allergies (verified) Risperidone, Codeine, and Hydrocodone-acetaminophen    History: Past Medical History:  Diagnosis Date   Alzheimer disease (HCC) 2005   Chronic headaches    Fibromyalgia    High cholesterol    Hypertension    Seasonal allergies    Past Surgical History:  Procedure Laterality Date   BACK SURGERY     FOOT SURGERY     Christie Sanders   left knee surgery  Left    replacement   LUMBAR FUSION     MEDIAL PARTIAL KNEE REPLACEMENT     left   VESICOVAGINAL FISTULA CLOSURE W/ TAH     Family History  Problem Relation Age of Onset   Hypertension Mother    Brain cancer Mother    Alcoholism Father    Aneurysm Father    COPD Sister    Lung cancer Sister        smoker   Atrial fibrillation Sister    Heart disease Sister    Lung cancer Daughter    COPD Daughter    Emphysema Sister        living   Allergies Sister        living   Heart disease Sister        living   Lung cancer Sister        living   Lung cancer Brother        deceased   Aneurysm Brother        deceased   Social History   Socioeconomic History   Marital status: Widowed    Spouse name: Not on file   Number of children: Not on file   Years of education: Not on file   Highest education level: Not on file  Occupational History   Occupation: retired     Comment: textile work   Occupation: retired  Tobacco Use   Smoking status: Never   Smokeless tobacco: Never  Vaping Use   Vaping status: Never Used  Substance and Sexual Activity   Alcohol use: Never   Drug use: Never   Sexual activity: Not Currently    Partners: Male  Other Topics Concern   Not on file  Social History Narrative   ** Merged History Encounter **       ** Data from: 04/26/21 Enc Dept: [facility]   Lives alone    Across from daughter   Husband deceased       ** Data from: 23-Oct-2022 Enc Dept: [facility]   Lives at home by herself, lives close with her son and DIL which they help her with transportation    Social Drivers of Health   Financial Resource Strain: Low Risk  (10/27/2023)   Overall Financial Resource Strain (CARDIA)    Difficulty of Paying Living Expenses: Not hard at all  Food Insecurity: No Food Insecurity (10/27/2023)  Hunger Vital Sign    Worried About Running Out of Food in the Last Year: Never true    Ran Out of Food in the Last Year: Never true  Transportation Needs: No Transportation Needs (10/27/2023)   PRAPARE - Administrator, Civil Service (Medical): No    Lack of Transportation (Non-Medical): No  Physical Activity: Sufficiently Active (10/27/2023)   Exercise Vital Sign    Days of Exercise per Week: 7 days    Minutes of Exercise per Session: 30 min  Stress: No Stress Concern Present (10/27/2023)   Christie Sanders    Feeling of Stress: Not at all  Social Connections: Moderately Integrated (10/27/2023)   Social Connection and Isolation Panel    Frequency of Communication with Friends and Family: More than three times a week    Frequency of Social Gatherings with Friends and Family: More than three times a week    Attends Religious Services: More than 4 times per year    Active Member of [employer] or Organizations: Yes    Attends Banker Meetings: More than 4 times per year    Marital Status: Widowed    Tobacco Counseling Counseling given: Not Answered    Clinical Intake:  Pre-visit preparation completed: Yes  Pain : 0-10 Pain Type: Chronic pain Pain Location: Foot Pain Orientation: Christie Sanders Pain Descriptors / Indicators: Aching Pain Onset: Today Pain Frequency: Constant     BMI - recorded: 32.61 Nutritional Status: BMI > 30  Obese Nutritional Risks: None Diabetes: No  No  results found for: HGBA1C   How often do you need to have someone help you when you read instructions, pamphlets, or other written materials from your doctor or pharmacy?: 1 - Never  Interpreter Needed?: No  Information entered by :: Christie Sanders,Christie Sanders   Activities of Daily Living     10/27/2023    4:21 PM  In your present state of health, do you have any difficulty performing the following activities:  Hearing? 0  Vision? 0  Difficulty concentrating or making decisions? 0  Walking or climbing stairs? 0  Dressing or bathing? 0  Doing errands, shopping? 0  Preparing Food and eating ? N  Using the Toilet? N  In the past six months, have you accidently leaked urine? Y  Do you have problems with loss of bowel control? Y  Managing your Medications? N  Comment patient has help  Managing your Finances? N  Comment patient has help  Housekeeping or managing your Housekeeping? N    Patient Care Team: Corwin Antu, FNP as PCP - General (Family Medicine) Nodal, Mickey Browner, PA-C (Physician Assistant)  I have updated your Care Teams any recent Medical Services you may have received from other providers in the past year.     Assessment:   This is a routine wellness examination for Christie Sanders.  Hearing/Vision screen Hearing Screening - Comments:: Some difficulties and no hearing aids  Vision Screening - Comments:: Patient wears glasses    Goals Addressed             This Visit's Progress    Patient Stated       Plant ,some flowers        Depression Screen     10/27/2023    4:24 PM 09/18/2023    8:17 AM 10/22/2022    3:01 PM  PHQ 2/9 Scores  PHQ - 2 Score 2 2 1   PHQ- 9 Score 4  8     Fall Risk     10/27/2023    4:20 PM 09/18/2023    8:16 AM 10/22/2022    3:08 PM  Fall Risk   Falls in the past year? 0 1 0  Number falls in past yr: 0 0 0  Injury with Fall? 0 0 0  Risk for fall due to : No Fall Risks  Impaired mobility;Impaired balance/gait  Follow up Falls  evaluation completed  Falls prevention discussed;Education provided    MEDICARE RISK AT HOME:  Medicare Risk at Home Any stairs in or around the home?: Yes If so, are there any without handrails?: No Home free of loose throw rugs in walkways, pet beds, electrical cords, etc?: Yes Adequate lighting in your home to reduce risk of falls?: Yes Life alert?: No Use of a cane, walker or w/c?: Yes Grab bars in the bathroom?: Yes Shower chair or bench in shower?: Yes Elevated toilet seat or a handicapped toilet?: Yes  TIMED UP AND GO:  Was the test performed?  No  Cognitive Function: 6CIT completed        10/27/2023    4:25 PM 10/22/2022    2:56 PM  6CIT Screen  What Year? 0 points 0 points  What month? 0 points 0 points  What time? 0 points 0 points  Count back from 20 0 points 0 points  Months in reverse 0 points 0 points  Repeat phrase 0 points 0 points  Total Score 0 points 0 points    Immunizations Immunization History  Administered Date(s) Administered   Fluzone Influenza virus vaccine,trivalent (IIV3), split virus 12/26/2018   Influenza Whole 01/23/2009   Influenza, High Dose Seasonal PF 01/24/2015, 12/19/2015, 12/23/2016, 12/24/2017   Influenza-Unspecified 12/23/2012   Pneumococcal Conjugate-13 08/09/2014   Pneumococcal Polysaccharide-23 06/29/2007, 03/25/2008   Zoster, Live 03/25/2012    Screening Tests Health Maintenance  Topic Date Due   COVID-19 Vaccine (1) Never done   Hepatitis C Screening  Never done   DTaP/Tdap/Td (1 - Tdap) Never done   Zoster Vaccines- Shingrix (1 of 2) 09/14/1963   DEXA SCAN  Never done   Pneumococcal Vaccine: 50+ Years (3 of 3 - PCV20 or PCV21) 08/09/2019   INFLUENZA VACCINE  10/24/2023   Medicare Annual Wellness (AWV)  10/26/2024   Hepatitis B Vaccines  Aged Out   HPV VACCINES  Aged Out   Meningococcal B Vaccine  Aged Out    Health Maintenance  Health Maintenance Due  Topic Date Due   COVID-19 Vaccine (1) Never done    Hepatitis C Screening  Never done   DTaP/Tdap/Td (1 - Tdap) Never done   Zoster Vaccines- Shingrix (1 of 2) 09/14/1963   DEXA SCAN  Never done   Pneumococcal Vaccine: 50+ Years (3 of 3 - PCV20 or PCV21) 08/09/2019   INFLUENZA VACCINE  10/24/2023   Health Maintenance Items Addressed:   Additional Screening:  Vision Screening: Recommended annual ophthalmology exams for early detection of glaucoma and other disorders of the eye. Would you like a referral to an eye doctor? No    Dental Screening: Recommended annual dental exams for proper oral hygiene  Community Resource Referral / Chronic Care Management: CRR required this visit?  No   CCM required this visit?  No   Plan:    I have personally reviewed and noted the following in the patient's chart:   Medical and social history Use of alcohol, tobacco or illicit drugs  Current medications and supplements including  opioid prescriptions. Patient is not currently taking opioid prescriptions. Functional ability and status Nutritional status Physical activity Advanced directives List of other physicians Hospitalizations, surgeries, and ER visits in previous 12 months Vitals Screenings to include cognitive, depression, and falls Referrals and appointments  In addition, I have reviewed and discussed with patient certain preventive protocols, quality metrics, and best practice recommendations. A written personalized care plan for preventive services as well as general preventive health recommendations were provided to patient.   Christie Sanders Christie Sanders, Christie Sanders   10/27/2023   After Visit Summary: (MyChart) Due to this being a telephonic visit, the after visit summary with patients personalized plan was offered to patient via MyChart   Notes: Nothing significant to report at this time.

## 2023-10-27 NOTE — Patient Instructions (Signed)
 [name] , Thank you for taking time out of your busy schedule to complete your Annual Wellness Visit with me. I enjoyed our conversation and look forward to speaking with you again next year. I, as well as your care team,  appreciate your ongoing commitment to your health goals. Please review the following plan we discussed and let me know if I can assist you in the future. Your Game plan/ To Do List    Referrals: If you haven't heard from the office you've been referred to, please reach out to them at the phone provided.   Follow up Visits: We will see or speak with you next year for your Next Medicare AWV with our clinical staff Have you seen your provider in the last 6 months (3 months if uncontrolled diabetes)? Yes  Clinician Recommendations:  Aim for 30 minutes of exercise or brisk walking, 6-8 glasses of water, and 5 servings of fruits and vegetables each day.       This is a list of the screenings recommended for you:  Health Maintenance  Topic Date Due   COVID-19 Vaccine (1) Never done   Hepatitis C Screening  Never done   DTaP/Tdap/Td vaccine (1 - Tdap) Never done   Zoster (Shingles) Vaccine (1 of 2) 09/14/1963   DEXA scan (bone density measurement)  Never done   Pneumococcal Vaccine for age over 74 (3 of 3 - PCV20 or PCV21) 08/09/2019   Medicare Annual Wellness Visit  10/22/2023   Flu Shot  10/24/2023   Hepatitis B Vaccine  Aged Out   HPV Vaccine  Aged Out   Meningitis B Vaccine  Aged Out    Advanced directives: (Declined) Advance directive discussed with you today. Even though you declined this today, please call our office should you change your mind, and we can give you the proper paperwork for you to fill out. Advance Care Planning is important because it:  [x]  Makes sure you receive the medical care that is consistent with your values, goals, and preferences  [x]  It provides guidance to your family and loved ones and reduces their decisional burden about whether or  not they are making the right decisions based on your wishes.  Follow the link provided in your after visit summary or read over the paperwork we have mailed to you to help you started getting your Advance Directives in place. If you need assistance in completing these, please reach out to us  so that we can help you!  See attachments for Preventive Care and Fall Prevention Tips.

## 2023-10-27 NOTE — Telephone Encounter (Signed)
 Can we change this echo to stat and see if we can move up? I know it's difficult with echos.

## 2023-10-31 NOTE — Telephone Encounter (Signed)
 I heard back from [facility] Echo dept, nothing until October  Per Echo dept:  [place], I am booking [facility] into October now. The CP department recommends STAT echos to go to ER.

## 2023-11-03 DIAGNOSIS — I480 Paroxysmal atrial fibrillation: Secondary | ICD-10-CM | POA: Insufficient documentation

## 2023-11-26 ENCOUNTER — Encounter: Payer: Self-pay | Admitting: Pediatrics

## 2023-12-08 ENCOUNTER — Other Ambulatory Visit: Payer: Self-pay | Admitting: *Deleted

## 2023-12-08 DIAGNOSIS — I10 Essential (primary) hypertension: Secondary | ICD-10-CM

## 2023-12-08 DIAGNOSIS — I509 Heart failure, unspecified: Secondary | ICD-10-CM

## 2023-12-08 DIAGNOSIS — I482 Chronic atrial fibrillation, unspecified: Secondary | ICD-10-CM

## 2023-12-08 MED ORDER — CARVEDILOL 6.25 MG PO TABS: 6.2500 mg | ORAL_TABLET | Freq: Two times a day (BID) | ORAL | 0 refills | Status: DC

## 2023-12-16 ENCOUNTER — Encounter: Payer: Self-pay | Admitting: Family

## 2023-12-16 DIAGNOSIS — I509 Heart failure, unspecified: Secondary | ICD-10-CM

## 2023-12-16 DIAGNOSIS — I482 Chronic atrial fibrillation, unspecified: Secondary | ICD-10-CM

## 2023-12-16 DIAGNOSIS — I10 Essential (primary) hypertension: Secondary | ICD-10-CM

## 2023-12-16 DIAGNOSIS — F419 Anxiety disorder, unspecified: Secondary | ICD-10-CM

## 2023-12-17 NOTE — Telephone Encounter (Signed)
 Carvedilol  was just filled for 90 days no refills on 9/15. I have pended the others for refill if ok to send

## 2023-12-19 MED ORDER — VENLAFAXINE HCL ER 37.5 MG PO CP24
37.5000 mg | ORAL_CAPSULE | Freq: Every day | ORAL | 3 refills | Status: DC
Start: 2023-12-19 — End: 2024-01-07

## 2023-12-25 ENCOUNTER — Other Ambulatory Visit: Admitting: Cardiology

## 2024-01-01 ENCOUNTER — Ambulatory Visit: Attending: Family

## 2024-01-01 ENCOUNTER — Other Ambulatory Visit: Payer: Self-pay | Admitting: Family

## 2024-01-01 DIAGNOSIS — K529 Noninfective gastroenteritis and colitis, unspecified: Secondary | ICD-10-CM

## 2024-01-01 DIAGNOSIS — F419 Anxiety disorder, unspecified: Secondary | ICD-10-CM

## 2024-01-01 DIAGNOSIS — I509 Heart failure, unspecified: Secondary | ICD-10-CM

## 2024-01-01 DIAGNOSIS — I482 Chronic atrial fibrillation, unspecified: Secondary | ICD-10-CM

## 2024-01-01 DIAGNOSIS — R0609 Other forms of dyspnea: Secondary | ICD-10-CM

## 2024-01-01 DIAGNOSIS — R600 Localized edema: Secondary | ICD-10-CM

## 2024-01-01 DIAGNOSIS — E782 Mixed hyperlipidemia: Secondary | ICD-10-CM

## 2024-01-01 DIAGNOSIS — Z8619 Personal history of other infectious and parasitic diseases: Secondary | ICD-10-CM

## 2024-01-01 DIAGNOSIS — I10 Essential (primary) hypertension: Secondary | ICD-10-CM

## 2024-01-01 DIAGNOSIS — R350 Frequency of micturition: Secondary | ICD-10-CM

## 2024-01-01 DIAGNOSIS — R5383 Other fatigue: Secondary | ICD-10-CM

## 2024-01-01 DIAGNOSIS — K644 Residual hemorrhoidal skin tags: Secondary | ICD-10-CM

## 2024-01-01 DIAGNOSIS — I4891 Unspecified atrial fibrillation: Secondary | ICD-10-CM

## 2024-01-01 DIAGNOSIS — F0280 Dementia in other diseases classified elsewhere without behavioral disturbance: Secondary | ICD-10-CM

## 2024-01-01 DIAGNOSIS — R3915 Urgency of urination: Secondary | ICD-10-CM

## 2024-01-07 MED ORDER — VENLAFAXINE HCL ER 37.5 MG PO CP24: 37.5000 mg | ORAL_CAPSULE | Freq: Every day | ORAL | 3 refills | Status: DC

## 2024-01-07 NOTE — Addendum Note (Signed)
 Addended by: CORWIN ANTU on: 01/07/2024 10:21 AM   Modules accepted: Orders

## 2024-01-13 NOTE — Progress Notes (Addendum)
 [facility] Initial Consultation   Referring Provider Corwin Antu, FNP [address] [place],  [place] [address]  Primary Care Provider Corwin Antu, FNP  Patient Profile: Christie Sanders is a 78 y.o. female who is seen in consultation in the [facility] at the request of Dr. Dugal for evaluation and management of the problem(s) noted below.  Problem List: Chronic diarrhea Fecal incontinence Rectal bleeding and hemorrhoids   History of Present Illness     Discussed the use of AI scribe software for clinical note transcription with the patient, who gave verbal consent to proceed.  History of Present Illness Ms. Michna is a 78 year old female with a past medical history noteworthy for Alzheimer's disease, fibrillation on Xarelto , fibromyalgia, headaches, HTN, seasonal allergies gastroenterology office for evaluation of chronic diarrhea and fecal incontinence.  Ms. Parks is accompanied to the gastroenterology office today by her daughter-in-law who provides history in conjunction with her.  Chronic diarrhea and fecal incontinence - Chronic diarrhea present for many years but progressively worsening over the past three years - Two or more watery or paste-like bowel movements per day but sometimes can have up to 10 stools a day - Diarrhea occurs both during the day and at night - Increased frequency of diarrhea after eating -eating often stimulates a bowel movement - Has intermittent symptoms of gas and bloating  - No associated abdominal pain, cramps, nausea, or vomiting - Denies constipation to suggest constipation with overflow encopresis - Also has a history of urinary incontinence for which she wears depends - Denies spinal cord issues; no weakness in lower extremities - No travel or ill contacts  - No dietary triggers identified - High fluid intake, primarily water - Denies starting new medications or OTC agents that would stimulate  diarrhea - Uses Imodium and Pepto-Bismol on a as needed basis in order to control loose stool  - Reports that her father had a form of stomach problems but no specific diagnosis - No family history of colorectal cancer or polyps  - No recent colonoscopy; last colonoscopy possibly in 2018 -no formal report available - Stool sample negative for infectious etiology 09/2023 performed by PCP  Hemorrhoidal bleeding - Occasional blood in the stool attributed to hemorrhoids - Use of topical creams and suppositories for symptom management  Medical comorbidities - History of paroxysmal A-fib on Xarelto -no bleeding issues - Chart reports a history of CHF -last echo 2023 EF 50 to 55% - Repeat echo pending for 01/2024 - Denies chest pain, shortness of breath, DOE  GI Review of Symptoms Significant for fecal incontinence and diarrhea. Otherwise negative.  General Review of Systems  Review of systems is significant for the pertinent positives and negatives as listed per the HPI.  Full ROS is otherwise negative.  Past Medical History   Past Medical History:  Diagnosis Date   Alzheimer disease (HCC) 03/26/2003   Atrial fibrillation (HCC)    Chronic headaches    Fibromyalgia    High cholesterol    Hypertension    Seasonal allergies      Past Surgical History   Past Surgical History:  Procedure Laterality Date   BACK SURGERY     FOOT SURGERY     right   left knee surgery  Left    replacement   LUMBAR FUSION     MEDIAL PARTIAL KNEE REPLACEMENT     left   VESICOVAGINAL FISTULA CLOSURE W/ TAH       Allergies and Medications  Allergies  Allergen Reactions   Risperidone Anaphylaxis   Codeine Swelling   Hydrocodone-Acetaminophen  Other (See Comments)    Reaction: Sweating (intolerance)    Current Meds  Medication Sig   acetaminophen  (TYLENOL ) 325 MG tablet Take 2 tablets (650 mg total) by mouth every 6 (six) hours as needed for headache.   carvedilol  (COREG ) 6.25 MG tablet  Take 1 tablet (6.25 mg total) by mouth 2 (two) times daily.   Cholecalciferol (VITAMIN D-3 PO) Take by mouth.   diltiazem  (CARDIZEM  CD) 180 MG 24 hr capsule Take 1 capsule (180 mg total) by mouth daily.   furosemide  (LASIX ) 20 MG tablet Take 1 tablet (20 mg total) by mouth every other day.   hydrocortisone  (ANUSOL -HC) 25 MG suppository Place 1 suppository (25 mg total) rectally 2 (two) times daily.   potassium chloride  (KLOR-CON ) 10 MEQ tablet Take 1 tablet (10 mEq total) by mouth 2 (two) times daily.   pravastatin  (PRAVACHOL ) 40 MG tablet Take 1 tablet (40 mg total) by mouth at bedtime.   venlafaxine  XR (EFFEXOR  XR) 37.5 MG 24 hr capsule Take 1 capsule (37.5 mg total) by mouth daily with breakfast.   XARELTO  20 MG TABS tablet Take 1 tablet (20 mg total) by mouth daily.    Family History   Family History  Problem Relation Age of Onset   Hypertension Mother    Brain cancer Mother    Alcoholism Father    Aneurysm Father    COPD Sister    Lung cancer Sister        smoker   Atrial fibrillation Sister    Heart disease Sister    Lung cancer Daughter    COPD Daughter    Emphysema Sister        living   Allergies Sister        living   Heart disease Sister        living   Lung cancer Sister        living   Lung cancer Brother        deceased   Aneurysm Brother        deceased     Social History   Social History   Tobacco Use   Smoking status: Never   Smokeless tobacco: Never  Vaping Use   Vaping status: Never Used  Substance Use Topics   Alcohol use: Never   Drug use: Never   Elisandra reports that she has never smoked. She has never used smokeless tobacco. She reports that she does not drink alcohol and does not use drugs.  Vital Signs and Physical Examination   Vitals:   01/14/24 1000  BP: 128/70  Pulse: 72   Body mass index is 33.27 kg/m. Weight: 170 lb 6 oz (77.3 kg)  General: Early, sitting in chair, uses a walker to ambulate Head: Normocephalic and  atraumatic Eyes: Sclerae anicteric, EOMI Lungs: Clear throughout to auscultation Heart: Regular rate and rhythm; No murmurs, rubs or bruits Abdomen: Soft, non tender and non distended. No masses, hepatosplenomegaly or hernias noted. Normal Bowel sounds Rectal: Deferred Musculoskeletal: Symmetrical with no gross deformities   Review of Data  The following data was reviewed at the time of this encounter:  Laboratory Studies      Latest Ref Rng & Units 09/18/2023    8:56 AM 08/08/2022   10:22 AM 05/04/2021    3:06 AM  CBC  WBC 4.0 - 10.5 K/uL 6.8  7.6  16.3   Hemoglobin 12.0 - 15.0  g/dL 86.0  85.7  88.1   Hematocrit 36.0 - 46.0 % 41.9  41.4  34.8   Platelets 150.0 - 400.0 K/uL 275.0  270.0  398     No results found for: LIPASE    Latest Ref Rng & Units 09/18/2023    4:49 PM 08/08/2022   10:22 AM 05/04/2021    3:06 AM  CMP  Glucose 70 - 99 mg/dL 94  87  89   BUN 6 - 23 mg/dL 8  12  15    Creatinine 0.40 - 1.20 mg/dL 9.34  9.31  9.25   Sodium 135 - 145 mEq/L 141  139  133   Potassium 3.5 - 5.1 mEq/L 3.7  3.8  3.9   Chloride 96 - 112 mEq/L 103  103  97   CO2 19 - 32 mEq/L 28  26  28    Calcium 8.4 - 10.5 mg/dL 9.7  9.7  8.7   Total Protein 6.0 - 8.3 g/dL 6.7  7.0  5.1   Total Bilirubin 0.2 - 1.2 mg/dL 0.9  0.7  0.4   Alkaline Phos 39 - 117 U/L 85  74  71   AST 0 - 37 U/L 18  19  41   ALT 0 - 35 U/L 12  13  51    Lab Results  Component Value Date   TSH 1.41 09/18/2023   FREET4 1.05 09/18/2023   Vitamin B12 428  Stool studies 10/03/2023 C. difficile negative GI stool pathogen panel negative Giardia negative   Imaging Studies  None  GI Procedures and Studies   Colonoscopy 2018 - ? normal  Colonoscopy 2008   Clinical Impression  It is my clinical impression that Ms. Tindel is a 78 y.o. female with;  Chronic diarrhea Fecal incontinence Rectal bleeding and hemorrhoids  Ms. Fahrney presents to the office for evaluation of chronic diarrhea dating back many years  now with progressive symptoms and associated fecal incontinence.  She relates that she has had longstanding issues with diarrhea in the postprandial state but no specific food triggers.  More recently her symptoms have worsened in terms of frequency.  States that she has both urinary and fecal incontinence.  Denies spinal cord issues or lower extremity weakness.  Stool studies for enteric pathogen performed by her PCP over the summer were unremarkable.  She believes that she had a colonoscopy in the past.'s records suggest this may have been in 2018 although report is not available.  She also reports intermittent rectal bleeding which is most likely related to hemorrhoids.  We discussed that the differential diagnosis for her symptoms could include celiac disease, nonceliac gluten sensitivity, SIBO, microscopic colitis, food sensitivity, IBS, pancreatic insufficiency, inflammatory bowel disease, and liver insufficiency.  Although she is not endorsing symptoms of constipation the possibility of constipation with overflow encopresis is in the differential diagnosis.  At today's visit, I recommended performing laboratory and stool studies for noninvasive evaluation of etiologies of diarrhea as outlined above.  Discussed that if we are not able to elucidate a cause of her diarrhea with laboratory and stool test we may need to consider the possibility of performing a colonoscopy.  If this is the case she will need cardiac clearance given her history of paroxysmal A-fib and Xarelto  use.   Plan  Labs today: Cortisol, ESR, CRP, iron panel, folate, celiac panel Stool studies: Fecal calprotectin, pancreatic elastase, fecal fat If above workup is unrevealing we will consider obtaining a KUB to rule out constipation  with overflow encopresis We will consider possibility of future colonoscopy if a source of diarrhea and incontinence are unable to be determined with noninvasive testing -patient has echo pending in  November 2025 and would recommend scheduling colonoscopy after this is completed if colonoscopy is necessary In the short-term may use Imodium and Pepto-Bismol as needed to manage bowel symptoms. Will consider the possibility of pelvic floor physical therapy in the future given history of both bowel and bladder incontinence.  Planned Follow Up 4 months  The patient or caregiver verbalized understanding of the material covered, with no barriers to understanding. All questions were answered. Patient or caregiver is agreeable with the plan outlined above.    It was a pleasure to see Tricia.  If you have any questions or concerns regarding this evaluation, do not hesitate to contact me.  Inocente Hausen, MD [facility]

## 2024-01-14 ENCOUNTER — Ambulatory Visit: Admitting: Pediatrics

## 2024-01-14 ENCOUNTER — Encounter: Payer: Self-pay | Admitting: Pediatrics

## 2024-01-14 ENCOUNTER — Other Ambulatory Visit (INDEPENDENT_AMBULATORY_CARE_PROVIDER_SITE_OTHER)

## 2024-01-14 VITALS — BP 128/70 | HR 72 | Ht 60.0 in | Wt 170.4 lb

## 2024-01-14 DIAGNOSIS — K625 Hemorrhage of anus and rectum: Secondary | ICD-10-CM | POA: Diagnosis not present

## 2024-01-14 DIAGNOSIS — R159 Full incontinence of feces: Secondary | ICD-10-CM | POA: Diagnosis not present

## 2024-01-14 DIAGNOSIS — R197 Diarrhea, unspecified: Secondary | ICD-10-CM

## 2024-01-14 LAB — C-REACTIVE PROTEIN: CRP: <0.5 mg/dL (ref 0.5–20.0)

## 2024-01-14 LAB — FOLATE: Folate: 12.9 ng/mL (ref >5.9)

## 2024-01-14 LAB — CORTISOL: Cortisol, Plasma: 5 ug/dL

## 2024-01-14 LAB — IBC + FERRITIN
Ferritin: 41.6 ng/mL (ref 10.0–291.0)
Iron: 71 ug/dL (ref 42–145)
Saturation Ratios: 17.5 % — ABNORMAL LOW (ref 20.0–50.0)
TIBC: 404.6 ug/dL (ref 250.0–450.0)
Transferrin: 289 mg/dL (ref 212.0–360.0)

## 2024-01-14 LAB — SEDIMENTATION RATE: Sed Rate: 6 mm/h (ref 0–30)

## 2024-01-14 NOTE — Patient Instructions (Signed)
 Your provider has requested that you go to the basement level for lab work before leaving today. Press B on the elevator. The lab is located at the first door on the left as you exit the elevator.  Due to recent changes in healthcare laws, you may see the results of your imaging and laboratory studies on MyChart before your provider has had a chance to review them.  We understand that in some cases there may be results that are confusing or concerning to you. Not all laboratory results come back in the same time frame and the provider may be waiting for multiple results in order to interpret others.  Please give us  48 hours in order for your provider to thoroughly review all the results before contacting the office for clarification of your results.   Follow up in 4 months if needed.  Thank you for entrusting me with your care and for choosing [facility], Dr. Inocente Hausen  _______________________________________________________  If your blood pressure at your visit was 140/90 or greater, please contact your primary care physician to follow up on this.  _______________________________________________________  If you are age 79 or older, your body mass index should be between 23-30. Your Body mass index is 33.27 kg/m. If this is out of the aforementioned range listed, please consider follow up with your Primary Care Provider.  If you are age 79 or younger, your body mass index should be between 19-25. Your Body mass index is 33.27 kg/m. If this is out of the aformentioned range listed, please consider follow up with your Primary Care Provider.   ________________________________________________________  The  GI providers would like to encourage you to use MYCHART to communicate with providers for non-urgent requests or questions.  Due to long hold times on the telephone, sending your provider a message by [facility] may be a faster and more efficient way to get a response.  Please  allow 48 business hours for a response.  Please remember that this is for non-urgent requests.  _______________________________________________________  [facility] is using a team-based approach to care.  Your team is made up of your doctor and two to three APPS. Our APPS (Nurse Practitioners and Physician Assistants) work with your physician to ensure care continuity for you. They are fully qualified to address your health concerns and develop a treatment plan. They communicate directly with your gastroenterologist to care for you. Seeing the Advanced Practice Practitioners on your physician's team can help you by facilitating care more promptly, often allowing for earlier appointments, access to diagnostic testing, procedures, and other specialty referrals.

## 2024-01-15 ENCOUNTER — Ambulatory Visit: Payer: Self-pay | Admitting: Pediatrics

## 2024-01-15 LAB — IGA: Immunoglobulin A: 107 mg/dL (ref 70–320)

## 2024-01-15 LAB — TISSUE TRANSGLUTAMINASE ABS,IGG,IGA
(tTG) Ab, IgA: <1 U/mL
(tTG) Ab, IgG: 8.8 U/mL

## 2024-01-22 ENCOUNTER — Other Ambulatory Visit

## 2024-01-22 DIAGNOSIS — K625 Hemorrhage of anus and rectum: Secondary | ICD-10-CM

## 2024-01-22 DIAGNOSIS — R197 Diarrhea, unspecified: Secondary | ICD-10-CM

## 2024-01-27 LAB — FECAL FAT, QUALITATIVE
Fat Qual Neutral, Stl: NORMAL
Fat Qual Total, Stl: NORMAL

## 2024-01-27 LAB — CALPROTECTIN, FECAL: Calprotectin, Fecal: 162 ug/g — ABNORMAL HIGH (ref 0–120)

## 2024-01-28 ENCOUNTER — Ambulatory Visit

## 2024-01-29 LAB — PANCREATIC ELASTASE, FECAL: Pancreatic Elastase-1, Stool: 745 ug/g (ref >200)

## 2024-02-18 ENCOUNTER — Ambulatory Visit (HOSPITAL_COMMUNITY)
Admission: RE | Admit: 2024-02-18 | Discharge: 2024-02-18 | Disposition: A | Discharge status: Home / Self Care | Source: Ambulatory Visit | Attending: Cardiology | Admitting: Cardiology

## 2024-02-18 DIAGNOSIS — R0600 Dyspnea, unspecified: Secondary | ICD-10-CM

## 2024-02-18 DIAGNOSIS — I509 Heart failure, unspecified: Secondary | ICD-10-CM | POA: Insufficient documentation

## 2024-02-18 LAB — ECHOCARDIOGRAM COMPLETE
AR max vel: 3.14 cm2
AV Area VTI: 3.23 cm2
AV Area mean vel: 3.18 cm2
AV Mean grad: 1.9 mmHg
AV Peak grad: 4.4 mmHg
Ao pk vel: 1.05 m/s
S' Lateral: 3.02 cm

## 2024-02-26 ENCOUNTER — Telehealth: Payer: Self-pay | Admitting: Family

## 2024-02-26 NOTE — Telephone Encounter (Signed)
 Copied from CRM #[id number]. Topic: General - Other >> Feb 26, 2024  9:18 AM Thersia BROCKS wrote: Reason for CRM: Patient daughter in law [place], called in stated she needs to speak with NP Ginger Patrick or her nurse would like for her to give her a callback    [phone]

## 2024-02-26 NOTE — Telephone Encounter (Signed)
LM for [facility] to return my call.

## 2024-02-27 NOTE — Telephone Encounter (Signed)
LM for [facility] to return my call.

## 2024-03-01 NOTE — Telephone Encounter (Signed)
LM for [facility] to return my call.

## 2024-03-02 ENCOUNTER — Ambulatory Visit: Admitting: Family

## 2024-03-03 ENCOUNTER — Ambulatory Visit: Admitting: Family Medicine

## 2024-03-05 ENCOUNTER — Ambulatory Visit: Admitting: Family Medicine

## 2024-03-15 ENCOUNTER — Other Ambulatory Visit: Payer: Self-pay | Admitting: *Deleted

## 2024-03-15 DIAGNOSIS — I482 Chronic atrial fibrillation, unspecified: Secondary | ICD-10-CM

## 2024-03-15 DIAGNOSIS — I509 Heart failure, unspecified: Secondary | ICD-10-CM

## 2024-03-15 DIAGNOSIS — I10 Essential (primary) hypertension: Secondary | ICD-10-CM

## 2024-03-15 MED ORDER — CARVEDILOL 6.25 MG PO TABS: 6.2500 mg | ORAL_TABLET | Freq: Two times a day (BID) | ORAL | 0 refills | Status: DC

## 2024-03-26 ENCOUNTER — Ambulatory Visit: Payer: Self-pay

## 2024-03-26 NOTE — Telephone Encounter (Signed)
 FYI Only or Action Required?: FYI only for provider: appointment scheduled on 1/5.  Patient was last seen in primary care on 10/03/2023 by Corwin Antu, FNP.  Called Nurse Triage reporting Urinary Frequency.  Symptoms began a week ago.  Interventions attempted: Nothing.  Symptoms are: unchanged.  Triage Disposition: See Physician Within 24 Hours  Patient/caregiver understands and will follow disposition?: Yes       Copied from CRM #[id number]. Topic: Clinical - Red Word Triage >> Mar 26, 2024 10:19 AM China J wrote: [facility] that prompted transfer to Nurse Triage: Burning while urinating, dark urine, confusion. Reason for Disposition  Bad or foul-smelling urine  Answer Assessment - Initial Assessment Questions 1. SYMPTOM: What's the main symptom you're concerned about? (e.g., frequency, incontinence)     Burning, darker-cloudy urine, confusion   2. ONSET: When did the  symptoms  start?      X 1 week   3. PAIN: Is there any pain? If Yes, ask: How bad is it? (Scale: 1-10; mild, moderate, severe)       4. CAUSE: What do you think is causing the symptoms?     Possible UTI   5. OTHER SYMPTOMS: Do you have any other symptoms? (e.g., blood in urine, fever, flank pain, pain with urination)     No      Patient's daughter in law called in to triage with complaints of Burning while urinating, dark urine, confusion in the patient This has been ongoing for one week.  She stated the patient is more confused and the last time this happened patient had a UTI  Appointment scheduled for further evaluation on 1/5 at alternate location, as per Epic the soonest avail. Appointment is 1/7; daughter-in-law Silvano agrees with the plan of care, and will reach out if symptoms worsen or persist; and or take patient to UC/ED over the weekend. .  Protocols used: Urinary Symptoms-A-AH

## 2024-03-29 ENCOUNTER — Encounter: Payer: Self-pay | Admitting: Family Medicine

## 2024-03-29 ENCOUNTER — Ambulatory Visit (INDEPENDENT_AMBULATORY_CARE_PROVIDER_SITE_OTHER): Admitting: Family Medicine

## 2024-03-29 VITALS — BP 149/90 | HR 69 | Resp 16 | Ht 60.0 in

## 2024-03-29 DIAGNOSIS — Z23 Encounter for immunization: Secondary | ICD-10-CM | POA: Diagnosis not present

## 2024-03-29 DIAGNOSIS — G309 Alzheimer's disease, unspecified: Secondary | ICD-10-CM

## 2024-03-29 DIAGNOSIS — R399 Unspecified symptoms and signs involving the genitourinary system: Secondary | ICD-10-CM

## 2024-03-29 DIAGNOSIS — R410 Disorientation, unspecified: Secondary | ICD-10-CM | POA: Diagnosis not present

## 2024-03-29 DIAGNOSIS — F0280 Dementia in other diseases classified elsewhere without behavioral disturbance: Secondary | ICD-10-CM

## 2024-03-29 LAB — POCT URINE DIPSTICK
Bilirubin, UA: NEGATIVE
Glucose, UA: NEGATIVE mg/dL
Ketones, POC UA: NEGATIVE mg/dL
Nitrite, UA: NEGATIVE
POC PROTEIN,UA: NEGATIVE
Spec Grav, UA: 1.015
Urobilinogen, UA: 0.2 U/dL
pH, UA: 6

## 2024-03-29 MED ORDER — CEFDINIR 300 MG PO CAPS: 600.0000 mg | ORAL_CAPSULE | Freq: Every day | ORAL | 0 refills | Status: AC

## 2024-03-29 NOTE — Progress Notes (Signed)
 "     Established patient visit   Patient: Christie Sanders   DOB: Nov 29, 1945   78 y.o. Female  MRN: [id number] Visit Date: 03/29/2024  Today's healthcare provider: Nancyann Perry, MD   Chief Complaint  Patient presents with   Acute Visit   Urinary Tract Infection    Possible UTI? Pt states she has been 2  wks   Subjective    Discussed the use of AI scribe software for clinical note transcription with the patient, who gave verbal consent to proceed.  History of Present Illness   Christie Sanders is a 79 year old female with recurrent urinary tract infections who presents with symptoms of a bladder infection. She is accompanied by her daughter-in-law, who acts as her caregiver.  She reports two to three weeks of dysuria, discolored urine described as 'orange', and a foul smell. She also notes fatigue and night sweats, which she describes as 'soaking wet'.  She has a history of recurrent urinary tract infections over the past three to four years, with one episode progressing to sepsis requiring hospitalization. Her daughter-in-law mentions that she becomes 'confused' during these episodes, which prompted the current visit. She reports pain in the flank area. She confirms experiencing sweats but does not report fever or chills.  A previous urinary tract infection was treated at urgent care a little over a month ago with a seven-day course of a sulfa antibiotic.  She has early dementia, which is relevant to her current medical condition and management.      Medications: Show/hide medication list[1]    Objective    BP (!) 149/90 (BP Location: Left Arm, Patient Position: Sitting, Cuff Size: Large)   Pulse 69   Resp 16   Ht 5' (1.524 m)   SpO2 97%   BMI 33.27 kg/m   Physical Exam    General appearance: Overweight female, cooperative and in no acute distress Head: Normocephalic, without obvious abnormality, atraumatic Respiratory: Respirations even and unlabored, normal  respiratory rate Extremities: All extremities are intact.  Skin: Skin color, texture, turgor normal. No rashes seen  Psych: Appropriate mood and affect. Neurologic: Mental status: Alert, oriented to person, place, and time, thought content appropriate.   Results for orders placed or performed in visit on 03/29/24  POCT URINE DIPSTICK  Result Value Ref Range   Color, UA straw (A) yellow   Clarity, UA clear clear   Glucose, UA negative negative mg/dL   Bilirubin, UA negative negative   Ketones, POC UA negative negative mg/dL   Spec Grav, UA 8.984 8.989 - 1.025   Blood, UA trace-intact (A) negative   pH, UA 6.0 5.0 - 8.0   POC PROTEIN,UA negative negative, trace   Urobilinogen, UA 0.2 0.2 or 1.0 E.U./dL   Nitrite, UA Negative Negative   Leukocytes, UA Moderate (2+) (A) Negative     Assessment & Plan    1. Urinary tract infection symptoms (Primary)  - Urine Culture - Urinalysis, microscopic only  - cefdinir  (OMNICEF ) 300 MG capsule; Take 2 capsules (600 mg total) by mouth daily for 5 days.  Dispense: 10 capsule; Refill: 0  2. Confusion Mostly back to baseline now. Likely exacerbation of underlying Alzheimer's secondary to infection. Expect complete resolution with successful treatment of UTI.   3. Alzheimer disease (HCC) Follow up PCP  4. Flu vaccine due.   - Flu vaccine HIGH DOSE PF(Fluzone Trivalent)      Nancyann Perry, MD  [facility] [phone] (phone)  [phone] (fax)  [facility]     [1]  Outpatient Medications Prior to Visit  Medication Sig   acetaminophen  (TYLENOL ) 325 MG tablet Take 2 tablets (650 mg total) by mouth every 6 (six) hours as needed for headache.   carvedilol  (COREG ) 6.25 MG tablet Take 1 tablet (6.25 mg total) by mouth 2 (two) times daily.   Cholecalciferol (VITAMIN D-3 PO) Take by mouth.   diltiazem  (CARDIZEM  CD) 180 MG 24 hr capsule Take 1 capsule (180 mg total) by mouth daily.   furosemide   (LASIX ) 20 MG tablet Take 1 tablet (20 mg total) by mouth every other day.   hydrocortisone  (ANUSOL -HC) 25 MG suppository Place 1 suppository (25 mg total) rectally 2 (two) times daily.   potassium chloride  (KLOR-CON ) 10 MEQ tablet Take 1 tablet (10 mEq total) by mouth 2 (two) times daily.   pravastatin  (PRAVACHOL ) 40 MG tablet Take 1 tablet (40 mg total) by mouth at bedtime.   venlafaxine  XR (EFFEXOR  XR) 37.5 MG 24 hr capsule Take 1 capsule (37.5 mg total) by mouth daily with breakfast.   XARELTO  20 MG TABS tablet Take 1 tablet (20 mg total) by mouth daily.   No facility-administered medications prior to visit.   "

## 2024-03-30 LAB — URINALYSIS, MICROSCOPIC ONLY
Bacteria, UA: NONE SEEN
Casts: NONE SEEN /LPF

## 2024-03-31 ENCOUNTER — Ambulatory Visit: Payer: Self-pay | Admitting: Family Medicine

## 2024-03-31 LAB — URINE CULTURE

## 2024-04-06 ENCOUNTER — Ambulatory Visit (INDEPENDENT_AMBULATORY_CARE_PROVIDER_SITE_OTHER): Admitting: Family

## 2024-04-06 ENCOUNTER — Encounter: Payer: Self-pay | Admitting: Family

## 2024-04-06 VITALS — BP 126/78 | HR 82 | Temp 98.1°F | Ht 60.0 in | Wt 172.6 lb

## 2024-04-06 DIAGNOSIS — R921 Mammographic calcification found on diagnostic imaging of breast: Secondary | ICD-10-CM

## 2024-04-06 DIAGNOSIS — R82998 Other abnormal findings in urine: Secondary | ICD-10-CM | POA: Diagnosis not present

## 2024-04-06 DIAGNOSIS — R350 Frequency of micturition: Secondary | ICD-10-CM | POA: Diagnosis not present

## 2024-04-06 DIAGNOSIS — M79629 Pain in unspecified upper arm: Secondary | ICD-10-CM | POA: Diagnosis not present

## 2024-04-06 DIAGNOSIS — R590 Localized enlarged lymph nodes: Secondary | ICD-10-CM

## 2024-04-06 LAB — CBC WITH DIFFERENTIAL/PLATELET
Basophils Absolute: 0.1 K/uL (ref 0.0–0.1)
Basophils Relative: 0.7 % (ref 0.0–3.0)
Eosinophils Absolute: 0.3 K/uL (ref 0.0–0.7)
Eosinophils Relative: 4.2 % (ref 0.0–5.0)
HCT: 40.4 % (ref 36.0–46.0)
Hemoglobin: 13.6 g/dL (ref 12.0–15.0)
Lymphocytes Relative: 32.4 % (ref 12.0–46.0)
Lymphs Abs: 2.4 K/uL (ref 0.7–4.0)
MCHC: 33.8 g/dL (ref 30.0–36.0)
MCV: 95.6 fl (ref 78.0–100.0)
Monocytes Absolute: 0.7 K/uL (ref 0.1–1.0)
Monocytes Relative: 9 % (ref 3.0–12.0)
Neutro Abs: 4 K/uL (ref 1.4–7.7)
Neutrophils Relative %: 53.7 % (ref 43.0–77.0)
Platelets: 207 K/uL (ref 150.0–400.0)
RBC: 4.22 Mil/uL (ref 3.87–5.11)
RDW: 13.9 % (ref 11.5–15.5)
WBC: 7.5 K/uL (ref 4.0–10.5)

## 2024-04-06 LAB — URINALYSIS, ROUTINE W REFLEX MICROSCOPIC
Bilirubin Urine: NEGATIVE
Ketones, ur: NEGATIVE
Leukocytes,Ua: NEGATIVE
Nitrite: NEGATIVE
Specific Gravity, Urine: 1.01 (ref 1.000–1.030)
Total Protein, Urine: NEGATIVE
Urine Glucose: NEGATIVE
Urobilinogen, UA: 0.2 (ref 0.0–1.0)
pH: 7 (ref 5.0–8.0)

## 2024-04-06 LAB — BASIC METABOLIC PANEL WITH GFR
BUN: 10 mg/dL (ref 6–23)
CO2: 28 meq/L (ref 19–32)
Calcium: 9.4 mg/dL (ref 8.4–10.5)
Chloride: 104 meq/L (ref 96–112)
Creatinine, Ser: 0.58 mg/dL (ref 0.40–1.20)
GFR: 85.99 mL/min
Glucose, Bld: 86 mg/dL (ref 70–99)
Potassium: 3.8 meq/L (ref 3.5–5.1)
Sodium: 140 meq/L (ref 135–145)

## 2024-04-06 LAB — POCT URINE DIPSTICK
Bilirubin, UA: NEGATIVE
Glucose, UA: NEGATIVE mg/dL
Ketones, POC UA: NEGATIVE mg/dL
Nitrite, UA: NEGATIVE
POC PROTEIN,UA: NEGATIVE
Spec Grav, UA: 1.015
Urobilinogen, UA: 0.2 U/dL
pH, UA: 6.5

## 2024-04-06 NOTE — Telephone Encounter (Signed)
 Saw patient already, thank you for speaking with the patient.  Please see note for further information.

## 2024-04-06 NOTE — Progress Notes (Signed)
 "  Established Patient Office Visit  Subjective:      CC:  Chief Complaint  Patient presents with   Acute Visit    Feels like she could still having a UTI. Wants a breast exam, states that she has issues with pain under both of her arms.    HPI: Christie Sanders is a 79 y.o. female presenting on 04/06/2024 for Acute Visit (Feels like she could still having a UTI. Wants a breast exam, states that she has issues with pain under both of her arms.) .  Discussed the use of AI scribe software for clinical note transcription with the patient, who gave verbal consent to proceed.  History of Present Illness Christie Sanders is a 79 year old female who presents with concerns of a persistent urinary tract infection. She is accompanied by her daughter-in-law.  She was previously seen on January 5th and was prescribed cefdinir  for five days, but she is unsure if the medication was taken properly. Despite treatment, she continues to experience discolored urine, described as 'pretty yellow,' and left-sided back pain that occurs with movement, particularly when turning in bed. She has increased urinary frequency and urgency, with occasional difficulty holding urine. No fever is present. A urine culture from January 5th showed no infection, but calcium oxalate crystals were present.  She has a history of urinary tract infections, with a severe episode leading to sepsis and hospitalization approximately three to four years ago. She experienced burning and night sweats for two to three weeks, which have since improved. She also reported confusion, which has improved but was initially attributed to medication issues.  She reports soreness under her arms, which she initially thought was related to a fall. She has been wearing different bras since her mother's passing in September, which may contribute to the discomfort. No breast lumps or discharge have been noticed, but there is a family history of breast cancer  on her maternal side, with her aunt having died from it.         Social history:  Relevant past medical, surgical, family and social history reviewed and updated as indicated. Interim medical history since our last visit reviewed.  Allergies and medications reviewed and updated.  DATA REVIEWED: CHART IN EPIC     ROS: Negative unless specifically indicated above in HPI.   Current Medications[1]        Objective:        BP 126/78 (BP Location: Left Arm, Patient Position: Sitting, Cuff Size: Large)   Pulse 82   Temp 98.1 F (36.7 C) (Temporal)   Ht 5' (1.524 m)   Wt 172 lb 9.6 oz (78.3 kg)   SpO2 96%   BMI 33.71 kg/m   Physical Exam BREAST: Breasts tender bilaterally, right breast less tender. ABDOMEN: Tender bilaterally.  Wt Readings from Last 3 Encounters:  04/06/24 172 lb 9.6 oz (78.3 kg)  01/14/24 170 lb 6 oz (77.3 kg)  10/27/23 167 lb (75.8 kg)    Physical Exam Chest:  Breasts:    Right: Mass (tender densities noted right breast) and tenderness present. No inverted nipple or skin change.     Left: Mass (left lower midline inferior to left nipple) and tenderness present. No inverted nipple or skin change.  Abdominal:     Tenderness: There is no abdominal tenderness.  Lymphadenopathy:     Upper Body:     Right upper body: Axillary adenopathy present.          Results  Labs Urine culture (03/29/2024): Within normal limits Urinalysis (03/29/2024): Hematuria, calcium oxalate crystals, small amount of white blood cells, no nitrites  Radiology Mammogram (2017): No cystic masses or suspicious calcifications; spot compression over upper outer left breast demonstrates unchanged group of calcifications, slightly more coarse compared to prior exam, likely dystrophic  Assessment & Plan:   Assessment and Plan Assessment & Plan Hematuria and urinary symptoms Persistent urinary symptoms with hematuria. Previous urine culture showed no infection, but  urinalysis revealed blood and calcium oxalate crystals. Symptoms include left-sided back pain, increased urinary frequency, and urgency. No fever or significant abdominal pain. Differential includes possible nephrolithiasis due to calcium oxalate crystals. - Ordered repeat urinalysis and urine culture to assess for blood and infection. - Checked white blood cell count and kidney function.  Possible nephrolithiasis Presence of calcium oxalate crystals in urine suggests possible nephrolithiasis. Symptoms include left-sided back pain and urinary frequency, but no sharp, stabbing pain typical of kidney stones. Family history of kidney stones noted. - Monitor for sudden, severe pain indicative of kidney stones. - Reassess urine analysis for further evaluation.  Breast pain and axillary tenderness with calcifications Breast pain and axillary tenderness possibly related to new bras. Previous mammogram in 2017 showed calcifications, with a recommendation for follow-up in 12 months. Family history of breast cancer noted. No discharge or rash observed. - Will consider diagnostic mammogram due to family history and previous findings. - Evaluate bra fit and consider discontinuing use if causing discomfort.        Return if symptoms worsen or fail to improve.     Ginger Patrick, MSN, APRN, FNP-C [facility]        [1]  Current Outpatient Medications:    acetaminophen  (TYLENOL ) 325 MG tablet, Take 2 tablets (650 mg total) by mouth every 6 (six) hours as needed for headache., Disp: , Rfl:    carvedilol  (COREG ) 6.25 MG tablet, Take 1 tablet (6.25 mg total) by mouth 2 (two) times daily., Disp: 180 tablet, Rfl: 0   Cholecalciferol (VITAMIN D-3 PO), Take by mouth., Disp: , Rfl:    diltiazem  (CARDIZEM  CD) 180 MG 24 hr capsule, Take 1 capsule (180 mg total) by mouth daily., Disp: 90 capsule, Rfl: 0   potassium chloride  (KLOR-CON ) 10 MEQ tablet, Take 1 tablet (10 mEq total)  by mouth 2 (two) times daily., Disp: , Rfl:    pravastatin  (PRAVACHOL ) 40 MG tablet, Take 1 tablet (40 mg total) by mouth at bedtime., Disp: 90 tablet, Rfl: 2   venlafaxine  XR (EFFEXOR  XR) 37.5 MG 24 hr capsule, Take 1 capsule (37.5 mg total) by mouth daily with breakfast., Disp: 90 capsule, Rfl: 3   XARELTO  20 MG TABS tablet, Take 1 tablet (20 mg total) by mouth daily., Disp: , Rfl:   "

## 2024-04-06 NOTE — Patient Instructions (Signed)
" °  I have sent an electronic order over to your preferred location for the following:   []   Bilateral diagnostic mammogram   Please give this center a call to get scheduled at your convenience.   [x]   [facility]      [address] [place], [place]        [phone]         Make sure to wear two piece  clothing  No lotions powders or deodorants the day of the appointment Make sure to bring picture ID and insurance card.  Bring list of medications you are currently taking including any supplements.   "

## 2024-04-07 ENCOUNTER — Ambulatory Visit: Payer: Self-pay | Admitting: Family

## 2024-04-07 DIAGNOSIS — R311 Benign essential microscopic hematuria: Secondary | ICD-10-CM

## 2024-04-07 LAB — URINE CULTURE
MICRO NUMBER:: 17462742
Result:: NO GROWTH
SPECIMEN QUALITY:: ADEQUATE

## 2024-04-08 ENCOUNTER — Other Ambulatory Visit: Payer: Self-pay | Admitting: Family

## 2024-04-08 DIAGNOSIS — R82998 Other abnormal findings in urine: Secondary | ICD-10-CM

## 2024-04-08 DIAGNOSIS — M79629 Pain in unspecified upper arm: Secondary | ICD-10-CM

## 2024-04-08 DIAGNOSIS — R350 Frequency of micturition: Secondary | ICD-10-CM

## 2024-04-08 DIAGNOSIS — R921 Mammographic calcification found on diagnostic imaging of breast: Secondary | ICD-10-CM

## 2024-04-08 DIAGNOSIS — R590 Localized enlarged lymph nodes: Secondary | ICD-10-CM

## 2024-04-09 ENCOUNTER — Other Ambulatory Visit: Payer: Self-pay | Admitting: Family

## 2024-04-09 DIAGNOSIS — R921 Mammographic calcification found on diagnostic imaging of breast: Secondary | ICD-10-CM

## 2024-04-09 DIAGNOSIS — R82998 Other abnormal findings in urine: Secondary | ICD-10-CM

## 2024-04-09 DIAGNOSIS — N644 Mastodynia: Secondary | ICD-10-CM

## 2024-04-09 DIAGNOSIS — M79629 Pain in unspecified upper arm: Secondary | ICD-10-CM

## 2024-04-09 DIAGNOSIS — R590 Localized enlarged lymph nodes: Secondary | ICD-10-CM

## 2024-04-09 DIAGNOSIS — R350 Frequency of micturition: Secondary | ICD-10-CM

## 2024-04-14 ENCOUNTER — Ambulatory Visit

## 2024-04-14 ENCOUNTER — Ambulatory Visit
Admission: RE | Admit: 2024-04-14 | Discharge: 2024-04-14 | Disposition: A | Source: Ambulatory Visit | Attending: Family

## 2024-04-14 DIAGNOSIS — N644 Mastodynia: Secondary | ICD-10-CM

## 2024-04-14 DIAGNOSIS — M79629 Pain in unspecified upper arm: Secondary | ICD-10-CM

## 2024-04-15 ENCOUNTER — Encounter: Payer: Self-pay | Admitting: Family

## 2024-04-15 ENCOUNTER — Ambulatory Visit: Payer: Self-pay | Admitting: Family

## 2024-04-15 DIAGNOSIS — I509 Heart failure, unspecified: Secondary | ICD-10-CM

## 2024-04-15 DIAGNOSIS — I10 Essential (primary) hypertension: Secondary | ICD-10-CM

## 2024-04-15 DIAGNOSIS — I482 Chronic atrial fibrillation, unspecified: Secondary | ICD-10-CM

## 2024-04-20 NOTE — Progress Notes (Unsigned)
 " Cardiology Office Note   Date:  04/22/2024  ID:  Christie Sanders, Christie Sanders [birth year], MRN [id number] PCP: Corwin Antu, FNP  [facility] Providers Cardiologist:  Caron Poser, MD     History of Present Illness Christie Sanders is a 79 y.o. female PMH dementia, atrial fibrillation on Xarelto , TAA, HLD who presents to establish care  Patient referred for these issues on 04/15/2024.  Last LDL 84 08/2023. Previously followed by [facility] in 2024.  Discussed rate versus rhythm control at length today.  Somewhat hesitant to pursue rhythm control given underlying dementia.  She has also been in A-fib for several years now and her echo is beginning to show chronic signs.  No significant cardiovascular complaints present today.  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 - TTE 04/2021 normal biventricular function, mild MR, mild AR - Cardiac cath 10/2017 normal coronary arteries   ROS: Pt denies any chest discomfort, jaw pain, arm pain, palpitations, syncope, presyncope, orthopnea, PND, or LE edema.  Studies Reviewed I have independently reviewed the patient's ECG, previous medical records, previous blood work, previous cardiac testing.  Physical Exam VS:  BP 122/80 (BP Location: Left Arm, Patient Position: Sitting, Cuff Size: Large)   Pulse 62   Ht 5' 2 (1.575 m)   Wt 172 lb (78 kg)   SpO2 97%   BMI 31.46 kg/m        Wt Readings from Last 3 Encounters:  04/22/24 172 lb (78 kg)  04/22/24 173 lb (78.5 kg)  04/06/24 172 lb 9.6 oz (78.3 kg)    GEN: No acute distress. NECK: No JVD; No carotid bruits. CARDIAC: Irregular rate and rhythm, no murmurs, rubs, gallops. RESPIRATORY:  Clear to auscultation. EXTREMITIES:  Warm and well-perfused. No edema.  ASSESSMENT AND PLAN Longstanding persistent atrial fibrillation CHADS2VASC of at  least 4. Has been in rate-controlled atrial fibrillation since at least 04/2021. No prior discussions documented regarding rhythm control.  She does have underlying dementia.  Discussed rate versus rhythm control at length today.  Given her advanced age, frailty, and underlying dementia, we had a shared decision making discussion and settled on continuing rate control for now.  They are going to discuss as a family and get back to us  if they would like to pursue rhythm control.  Plan: - Continue Xarelto  20 mg daily - Continue Coreg  6.25 mg twice daily - We will stop her diltiazem ; heart rate is 60 in office today.  No indication for dual AV nodal blockade, she likely already has some underlying AV nodal disease and would not want to precipitate bradycardia.  Goal heart rate less than 100. - Can consider DCCV after family discussion if they decide they would like a trial at rhythm control  Moderate MR Moderate TR Mild to moderate AR Likely functional in etiology from chronic atrial fibrillation and biatrial dilation.  Will continue to follow with serial echocardiograms.  TAA 4.6 cm reported on last echo 01/2024.  Unlikely to be a candidate for surgical intervention given age/dementia/frailty.  Will continue to follow on serial echocardiograms for now.  HLD Last LDL 84 08/2023.  Appropriate for age and circumstances.  No known history of ASCVD events or subclinical ASCVD.  Continue pravastatin  40 mg daily        Dispo: RTC 6 months with repeat echocardiogram  Signed, Caron  Argentina, MD  "

## 2024-04-22 ENCOUNTER — Ambulatory Visit: Payer: Self-pay | Admitting: Family

## 2024-04-22 ENCOUNTER — Ambulatory Visit: Admitting: Family

## 2024-04-22 ENCOUNTER — Ambulatory Visit

## 2024-04-22 VITALS — BP 122/80 | HR 62 | Ht 62.0 in | Wt 172.0 lb

## 2024-04-22 VITALS — BP 120/82 | HR 79 | Temp 98.1°F | Wt 173.0 lb

## 2024-04-22 DIAGNOSIS — I7121 Aneurysm of the ascending aorta, without rupture: Secondary | ICD-10-CM

## 2024-04-22 DIAGNOSIS — E876 Hypokalemia: Secondary | ICD-10-CM | POA: Diagnosis not present

## 2024-04-22 DIAGNOSIS — F32A Depression, unspecified: Secondary | ICD-10-CM

## 2024-04-22 DIAGNOSIS — R82998 Other abnormal findings in urine: Secondary | ICD-10-CM | POA: Diagnosis not present

## 2024-04-22 DIAGNOSIS — R350 Frequency of micturition: Secondary | ICD-10-CM

## 2024-04-22 DIAGNOSIS — I351 Nonrheumatic aortic (valve) insufficiency: Secondary | ICD-10-CM | POA: Diagnosis not present

## 2024-04-22 DIAGNOSIS — N898 Other specified noninflammatory disorders of vagina: Secondary | ICD-10-CM

## 2024-04-22 DIAGNOSIS — E782 Mixed hyperlipidemia: Secondary | ICD-10-CM

## 2024-04-22 DIAGNOSIS — I10 Essential (primary) hypertension: Secondary | ICD-10-CM

## 2024-04-22 DIAGNOSIS — I4811 Longstanding persistent atrial fibrillation: Secondary | ICD-10-CM | POA: Diagnosis not present

## 2024-04-22 DIAGNOSIS — I071 Rheumatic tricuspid insufficiency: Secondary | ICD-10-CM | POA: Diagnosis not present

## 2024-04-22 DIAGNOSIS — F419 Anxiety disorder, unspecified: Secondary | ICD-10-CM | POA: Diagnosis not present

## 2024-04-22 DIAGNOSIS — E559 Vitamin D deficiency, unspecified: Secondary | ICD-10-CM | POA: Diagnosis not present

## 2024-04-22 DIAGNOSIS — I482 Chronic atrial fibrillation, unspecified: Secondary | ICD-10-CM | POA: Diagnosis not present

## 2024-04-22 DIAGNOSIS — I340 Nonrheumatic mitral (valve) insufficiency: Secondary | ICD-10-CM | POA: Diagnosis not present

## 2024-04-22 DIAGNOSIS — I509 Heart failure, unspecified: Secondary | ICD-10-CM | POA: Diagnosis not present

## 2024-04-22 LAB — POCT URINE DIPSTICK
Bilirubin, UA: NEGATIVE
Glucose, UA: NEGATIVE mg/dL
Ketones, POC UA: NEGATIVE mg/dL
Nitrite, UA: NEGATIVE
Spec Grav, UA: 1.025
Urobilinogen, UA: 0.2 U/dL
pH, UA: 6

## 2024-04-22 LAB — URINALYSIS, ROUTINE W REFLEX MICROSCOPIC
Bilirubin Urine: NEGATIVE
Hgb urine dipstick: NEGATIVE
Ketones, ur: NEGATIVE
Nitrite: NEGATIVE
Specific Gravity, Urine: 1.02 (ref 1.000–1.030)
Total Protein, Urine: NEGATIVE
Urine Glucose: NEGATIVE
Urobilinogen, UA: 0.2 (ref 0.0–1.0)
pH: 6 (ref 5.0–8.0)

## 2024-04-22 MED ORDER — VENLAFAXINE HCL ER 37.5 MG PO CP24: 37.5000 mg | ORAL_CAPSULE | Freq: Every day | ORAL | 3 refills | Status: AC

## 2024-04-22 MED ORDER — XARELTO 20 MG PO TABS: 20.0000 mg | ORAL_TABLET | Freq: Every day | ORAL | 3 refills | Status: AC

## 2024-04-22 MED ORDER — VITAMIN D3 50 MCG (2000 UT) PO CAPS: 2000.0000 [IU] | ORAL_CAPSULE | Freq: Every day | ORAL | 3 refills | Status: AC

## 2024-04-22 MED ORDER — DILTIAZEM HCL ER COATED BEADS 180 MG PO CP24: 180.0000 mg | ORAL_CAPSULE | Freq: Every day | ORAL | 0 refills | Status: DC

## 2024-04-22 MED ORDER — CARVEDILOL 6.25 MG PO TABS: 6.2500 mg | ORAL_TABLET | Freq: Two times a day (BID) | ORAL | 0 refills | Status: AC

## 2024-04-22 MED ORDER — PRAVASTATIN SODIUM 40 MG PO TABS: 40.0000 mg | ORAL_TABLET | Freq: Every day | ORAL | 2 refills | Status: AC

## 2024-04-22 NOTE — Patient Instructions (Signed)
 Medication Instructions:  Your physician recommends the following medication changes.  STOP TAKING:  Diltiazem  (Cardizem  CD) 180 mg  Continue all other medication as prescribed.  *If you need a refill on your cardiac medications before your next appointment, please call your pharmacy*  Lab Work:  No labs ordered today   If you have labs (blood work) drawn today and your tests are completely normal, you will receive your results only by: MyChart Message (if you have MyChart) OR A paper copy in the mail If you have any lab test that is abnormal or we need to change your treatment, we will call you to review the results.  Testing/Procedures:  Echocardiogram prior to 6 month follow up  Your physician has requested that you have an echocardiogram. Echocardiography is a painless test that uses sound waves to create images of your heart. It provides your doctor with information about the size and shape of your heart and how well your hearts chambers and valves are working.   You may receive an ultrasound enhancing agent through an IV if needed to better visualize your heart during the echo. This procedure takes approximately one hour.  There are no restrictions for this procedure.  This will take place at [address] [address], [place] [address]   Follow-Up: At [facility], you and your health needs are our priority.  As part of our continuing mission to provide you with exceptional heart care, our providers are all part of one team.  This team includes your primary Cardiologist (physician) and Advanced Practice Providers or APPs (Physician Assistants and Nurse Practitioners) who all work together to provide you with the care you need, when you need it.  Your next appointment:  6 month(s) (after Echocardiogram)  Provider:  Caron Poser, MD    We recommend signing up for the patient portal called MyChart.  Sign up information is provided on  this After Visit Summary.  MyChart is used to connect with patients for Virtual Visits (Telemedicine).  Patients are able to view lab/test results, encounter notes, upcoming appointments, etc.  Non-urgent messages can be sent to your provider as well.   To learn more about what you can do with MyChart, go to [email or web address].

## 2024-04-22 NOTE — Progress Notes (Signed)
 "  Established Patient Office Visit  Subjective:      CC:  Chief Complaint  Patient presents with   Follow-up    Still having issues with abnormal urine color and urinary frequency.    HPI: Christie Sanders is a 79 y.o. female presenting on 04/22/2024 for Follow-up (Still having issues with abnormal urine color and urinary frequency.) .  Discussed the use of AI scribe software for clinical note transcription with the patient, who gave verbal consent to proceed.  History of Present Illness Christie Sanders is a 79 year old female who presents with urinary symptoms including burning during urination. She is accompanied by her son.  She experiences burning sensations during urination and noted that her urine appeared orange at one point, although her daughter disagreed. She maintains good hydration, drinking water, ginger ale, and cranberry drinks. A previous urine test on January 13th was negative for infection but showed a small amount of blood.  No vaginal discharge or itching is present, but she noted some discomfort in the vaginal area starting yesterday. No lower abdominal pain or unusual back pain, despite a history of chronic back issues. There are no sharp or stabbing pains in her back or abdomen.  Her son mentions a family history of kidney stones.  She is currently taking carvedilol , Xarelto , pravastatin , venlafaxine , low dose aspirin , and vitamin D3. There is a discussion about her potassium supplementation, which she may have been taking due to previous use of diuretics like furosemide  and hydrochlorothiazide, but she is no longer on these medications.         Social history:  Relevant past medical, surgical, family and social history reviewed and updated as indicated. Interim medical history since our last visit reviewed.  Allergies and medications reviewed and updated.  DATA REVIEWED: CHART IN EPIC     ROS: Negative unless specifically indicated above in HPI.    Current Medications[1]        Objective:        BP 120/82 (BP Location: Left Arm, Patient Position: Sitting, Cuff Size: Large)   Pulse 79   Temp 98.1 F (36.7 C) (Temporal)   Wt 173 lb (78.5 kg)   SpO2 98%   BMI 33.79 kg/m   Physical Exam GENITOURINARY: Vaginal dryness and atrophy present.  Wt Readings from Last 3 Encounters:  04/22/24 173 lb (78.5 kg)  04/06/24 172 lb 9.6 oz (78.3 kg)  01/14/24 170 lb 6 oz (77.3 kg)    Physical Exam Genitourinary:    Vagina: Lesions (Petechiae and dryness noted) present. No vaginal discharge or tenderness.          Results Labs UA (04/06/2024): Negative for infection; microscopic hematuria present  Vaginal swab for culture Swab inserted into vaginal canal.  Assessment & Plan:   Assessment and Plan Assessment & Plan Persistent urinary symptoms with hematuria Persistent urinary symptoms with burning sensation during urination and hematuria. Previous urine culture was negative for infection. Differential includes urinary tract infection. No current evidence of kidney injury or nephrolithiasis. Vaginal atrophy may contribute to symptoms. - Sent urine culture to rule out infection - Monitor for persistent hematuria; will consider urology referral if hematuria persists, pt on xarelto , last cbc 1/13 without anemia Lab Results  Component Value Date   WBC 7.5 04/06/2024   HGB 13.6 04/06/2024   HCT 40.4 04/06/2024   MCV 95.6 04/06/2024   PLT 207.0 04/06/2024     Postmenopausal atrophic vaginitis Signs of vaginal atrophy with dryness and  burning sensation, likely due to lack of estrogen post-menopause. No active infection noted on examination. - Recommended Replens ointment for daily application to alleviate dryness and burning - Provided educational material on vaginal atrophy  Hypokalemia Previously on potassium supplementation, but no current medications causing hypokalemia. Potassium supplementation may not be  necessary. She was previously on hydrochlorothiazide and furosemide , but is no longer. - Discontinued potassium supplementation - Will repeat potassium levels in three months        Return in about 3 months (around 07/21/2024) for f/u labs.     Ginger Patrick, MSN, APRN, FNP-C [facility]        [1]  Current Outpatient Medications:    acetaminophen  (TYLENOL ) 325 MG tablet, Take 2 tablets (650 mg total) by mouth every 6 (six) hours as needed for headache., Disp: , Rfl:    Cholecalciferol (VITAMIN D3) 50 MCG (2000 UT) capsule, Take 1 capsule (2,000 Units total) by mouth daily., Disp: 90 capsule, Rfl: 3   potassium chloride  (KLOR-CON ) 10 MEQ tablet, Take 1 tablet (10 mEq total) by mouth 2 (two) times daily., Disp: , Rfl:    carvedilol  (COREG ) 6.25 MG tablet, Take 1 tablet (6.25 mg total) by mouth 2 (two) times daily., Disp: 180 tablet, Rfl: 0   diltiazem  (CARDIZEM  CD) 180 MG 24 hr capsule, Take 1 capsule (180 mg total) by mouth daily., Disp: 90 capsule, Rfl: 0   pravastatin  (PRAVACHOL ) 40 MG tablet, Take 1 tablet (40 mg total) by mouth at bedtime., Disp: 90 tablet, Rfl: 2   venlafaxine  XR (EFFEXOR  XR) 37.5 MG 24 hr capsule, Take 1 capsule (37.5 mg total) by mouth daily with breakfast., Disp: 90 capsule, Rfl: 3   XARELTO  20 MG TABS tablet, Take 1 tablet (20 mg total) by mouth daily., Disp: 90 tablet, Rfl: 3  "

## 2024-04-22 NOTE — Patient Instructions (Addendum)
" ° °  Let's stop the potassium for now  She is not on any medications anymore that should decrease the potassium At one time she was on furosemide  and triamterene hydrochlorothiazide that could have caused this  We will repeat levels in about 3 months.   "

## 2024-04-23 LAB — WET PREP BY MOLECULAR PROBE
Candida species: NOT DETECTED
Gardnerella vaginalis: NOT DETECTED
MICRO NUMBER:: 17526866
SPECIMEN QUALITY:: ADEQUATE
Trichomonas vaginosis: NOT DETECTED

## 2024-04-23 LAB — URINE CULTURE
MICRO NUMBER:: 17526856
Result:: NO GROWTH
SPECIMEN QUALITY:: ADEQUATE

## 2024-05-07 ENCOUNTER — Ambulatory Visit: Admitting: Sports Medicine

## 2024-10-12 ENCOUNTER — Ambulatory Visit

## 2024-10-28 ENCOUNTER — Ambulatory Visit

## 2024-10-29 ENCOUNTER — Ambulatory Visit
